# Patient Record
Sex: Female | Born: 1945 | Race: White | Hispanic: No | Smoking: Never smoker
Health system: Southern US, Community
[De-identification: ages and names within clinical notes are randomized; demographics above are authoritative.]

## PROBLEM LIST (undated history)

## (undated) DIAGNOSIS — S72009A Fracture of unspecified part of neck of unspecified femur, initial encounter for closed fracture: Secondary | ICD-10-CM

## (undated) DIAGNOSIS — I10 Essential (primary) hypertension: Secondary | ICD-10-CM

## (undated) HISTORY — DX: Fracture of unspecified part of neck of unspecified femur, initial encounter for closed fracture: S72.009A

---

## 2006-05-19 DIAGNOSIS — I10 Essential (primary) hypertension: Secondary | ICD-10-CM

## 2006-05-19 HISTORY — DX: Essential (primary) hypertension: I10

## 2011-11-17 DIAGNOSIS — S72009A Fracture of unspecified part of neck of unspecified femur, initial encounter for closed fracture: Secondary | ICD-10-CM

## 2011-11-17 HISTORY — DX: Fracture of unspecified part of neck of unspecified femur, initial encounter for closed fracture: S72.009A

## 2011-12-10 ENCOUNTER — Encounter (HOSPITAL_COMMUNITY): Payer: Self-pay | Admitting: Emergency Medicine

## 2011-12-10 ENCOUNTER — Emergency Department (HOSPITAL_COMMUNITY): Payer: Self-pay

## 2011-12-10 ENCOUNTER — Emergency Department (HOSPITAL_COMMUNITY)
Admission: EM | Admit: 2011-12-10 | Discharge: 2011-12-10 | Disposition: A | Discharge status: Home / Self Care | Payer: Self-pay | Attending: Emergency Medicine | Admitting: Emergency Medicine

## 2011-12-10 DIAGNOSIS — M5137 Other intervertebral disc degeneration, lumbosacral region: Secondary | ICD-10-CM | POA: Insufficient documentation

## 2011-12-10 DIAGNOSIS — S32599A Other specified fracture of unspecified pubis, initial encounter for closed fracture: Secondary | ICD-10-CM

## 2011-12-10 DIAGNOSIS — I10 Essential (primary) hypertension: Secondary | ICD-10-CM | POA: Insufficient documentation

## 2011-12-10 DIAGNOSIS — M51379 Other intervertebral disc degeneration, lumbosacral region without mention of lumbar back pain or lower extremity pain: Secondary | ICD-10-CM | POA: Insufficient documentation

## 2011-12-10 DIAGNOSIS — S32409A Unspecified fracture of unspecified acetabulum, initial encounter for closed fracture: Secondary | ICD-10-CM | POA: Insufficient documentation

## 2011-12-10 DIAGNOSIS — S32509A Unspecified fracture of unspecified pubis, initial encounter for closed fracture: Secondary | ICD-10-CM | POA: Insufficient documentation

## 2011-12-10 DIAGNOSIS — W010XXA Fall on same level from slipping, tripping and stumbling without subsequent striking against object, initial encounter: Secondary | ICD-10-CM | POA: Insufficient documentation

## 2011-12-10 DIAGNOSIS — M25559 Pain in unspecified hip: Secondary | ICD-10-CM | POA: Insufficient documentation

## 2011-12-10 DIAGNOSIS — M25529 Pain in unspecified elbow: Secondary | ICD-10-CM | POA: Insufficient documentation

## 2011-12-10 HISTORY — DX: Essential (primary) hypertension: I10

## 2011-12-10 LAB — POCT I-STAT, CHEM 8
Hemoglobin: 12.9 g/dL (ref 12.0–15.0)
Potassium: 3.9 mEq/L (ref 3.5–5.1)
Sodium: 139 mEq/L (ref 135–145)
TCO2: 22 mmol/L (ref 0–100)

## 2011-12-10 LAB — CBC
MCH: 30.2 pg (ref 26.0–34.0)
MCHC: 33.5 g/dL (ref 30.0–36.0)
Platelets: 276 10*3/uL (ref 150–400)
RBC: 4.21 MIL/uL (ref 3.87–5.11)

## 2011-12-10 MED ORDER — HYDROCODONE-ACETAMINOPHEN 5-325 MG PO TABS
1.0000 | ORAL_TABLET | Freq: Once | ORAL | Status: AC
  Administered 2011-12-10: 1 via ORAL
  Filled 2011-12-10: qty 1

## 2011-12-10 MED ORDER — PERCOCET 5-325 MG PO TABS: 1.0000 | ORAL_TABLET | Freq: Four times a day (QID) | ORAL | Status: AC | PRN

## 2011-12-10 NOTE — ED Notes (Signed)
Pt back from ct scanner with transport 

## 2011-12-10 NOTE — ED Provider Notes (Signed)
History     CSN: [id number]  Arrival date & time 12/10/11  0020   First MD Initiated Contact with Patient 12/10/11 0154      Chief Complaint  Patient presents with  . Fall    (Consider location/radiation/quality/duration/timing/severity/associated sxs/prior treatment) HPI Comments: Patient with a history of diabetes and hypertension presents emergency department with chief complaint of fall.  History is obtained from patient and translated through her daughter sitting bedside.  Patient states that while showering she slipped and fell with point of impact being her left elbow and her tailbone.  Fall was mechanical in nature and patient denies hitting her head or having any loss of consciousness.  Current pain is with left hip, left elbow and coccyx area.  She reports that she is able to ambulate however it is more painful and she has a limp.  Patient denies any neck pain, chest pain, headache, nausea, vomiting, abdominal pain, weakness, or numbness or tingling of extremities.  No other complaints this time.  Patient is a 66 y.o. female presenting with fall. The history is provided by the patient.  Fall Pertinent negatives include no fever, no numbness, no abdominal pain and no headaches.    Past Medical History  Diagnosis Date  . Diabetes mellitus   . Hypertension     History reviewed. No pertinent past surgical history.  History reviewed. No pertinent family history.  History  Substance Use Topics  . Smoking status: Never Smoker   . Smokeless tobacco: Not on file  . Alcohol Use: No    OB History    Grav Para Term Preterm Abortions TAB SAB Ect Mult Living                  Review of Systems  Constitutional: Positive for activity change. Negative for fever, chills and appetite change.  HENT: Negative for congestion.   Eyes: Negative for visual disturbance.  Respiratory: Negative for shortness of breath.   Cardiovascular: Negative for chest pain and leg swelling.    Gastrointestinal: Negative for abdominal pain.  Genitourinary: Negative for dysuria, urgency and frequency.  Musculoskeletal: Positive for gait problem.  Neurological: Negative for dizziness, syncope, weakness, light-headedness, numbness and headaches.  Psychiatric/Behavioral: Negative for confusion.  All other systems reviewed and are negative.    Allergies  Review of patient's allergies indicates no known allergies.  Home Medications   Current Outpatient Rx  Name Route Sig Dispense Refill  . PRESCRIPTION MEDICATION Oral Take 1 tablet by mouth daily. Patient says she is on Blood Pressure medication.  Called [facility] and they have no record of patient.      BP 153/77  Pulse 78  Temp 99.1 F (37.3 C) (Oral)  Resp 18  SpO2 98%  Physical Exam  Nursing note and vitals reviewed. Constitutional: She is oriented to person, place, and time. She appears well-developed and well-nourished. No distress.  HENT:  Head: Normocephalic and atraumatic.  Eyes: Conjunctivae and EOM are normal.  Neck: Normal range of motion.  Cardiovascular:       Intact distal pulses.  Pulmonary/Chest: Effort normal.  Musculoskeletal: Normal range of motion.       No leg length discrepancy.  Pain with internal and external rotation of left hip.  Tenderness to palpation over pubic ramus, left hip, left SI joint, & left elbow.  Normal range of motion of the elbow.  Neurological: She is alert and oriented to person, place, and time.  Skin: Skin is warm and dry. No rash  noted. She is not diaphoretic.       Refill less than 3 seconds.  Mild bruising on left upper extremity, dorsal surface overlying the proximal ulna  Psychiatric: She has a normal mood and affect. Her behavior is normal.    ED Course  Procedures (including critical care time)  Labs Reviewed  POCT I-STAT, CHEM 8 - Abnormal; Notable for the following:    Glucose, Bld 113 (*)     All other components within normal limits  CBC   Dg  Sacrum/coccyx  12/10/2011  *RADIOLOGY REPORT*  Clinical Data: Left hip pain.  SACRUM AND COCCYX - 2+ VIEW  Comparison: None.  Findings: Left inferior pubic ramus fractures noted.  Cannot exclude fracture of the left pubic body.  Possible transverse fracture the lower sacrum noted.  CT the pelvis could be utilized for further characterization.  IMPRESSION:  1.  Left inferior pubic ramus fracture.  Possible pubic body fracture on the left. 2.  Equivocal fracture of the left inferior sacrum.  CT could be utilized for further characterization if clinically warranted.  Original Report Authenticated By: Dellia Cloud, M.D.   Dg Elbow Complete Left  12/10/2011  *RADIOLOGY REPORT*  Clinical Data: Fall.  Posterior elbow pain.  LEFT ELBOW - COMPLETE 3+ VIEW  Comparison: None.  Findings: Spurring of the radial head and coronoid process noted. No elbow effusion is observed.  No fracture or malalignment identified.  There is abnormal subcutaneous edema dorsal to the proximal ulna.  IMPRESSION:  1.  Dorsal subcutaneous edema overlying the proximal ulna. 2.  Mild spurring. 3.   Otherwise, no significant abnormality identified.  Original Report Authenticated By: Dellia Cloud, M.D.   Dg Hip Complete Left  12/10/2011  *RADIOLOGY REPORT*  Clinical Data: Fall.  Left hip pain.  LEFT HIP - COMPLETE 2+ VIEW  Comparison: None.  Findings: Left inferior pubic ramus fracture noted.  Cannot exclude left pubic body fracture.  Such fractures are often accompanied by sacral fractures as well.  No fracture of the left proximal femur is observed.  IMPRESSION: 1.  Left inferior pubic ramus fracture.  Such a fracture is often accompanied by additional fractures of the left pubic body or superior ramus, and of the sacrum.  Original Report Authenticated By: Dellia Cloud, M.D.   Ct Pelvis Wo Contrast  12/10/2011  *RADIOLOGY REPORT*  Clinical Data:  Fall.  Pelvic pain.  CT PELVIS WITHOUT CONTRAST  Technique:   Multidetector CT imaging of the pelvis was performed following the standard protocol without intravenous contrast.  Comparison:  Conventional radiographs of 12/10/2011  Findings:  Relatively nondisplaced left acetabular fracture involving the anterior acetabulum and inferior pubic ramus noted. Definite extension up into the upper iliac bone is not observed to favor an anterior column fracture, and this is likely more of an anterior wall fracture.  There is spurring and irregularity of the pubic bodies bilaterally, but without a well seen pubic body fracture.  There is a mass-like density around the pubic bodies which could represent hematoma or may simply be secondary to arthropathy.  Slight irregularity in the lower sacrum near the sacrococcygeal junction noted, but without significant presacral stranding to further suggest fracture.  No sacral alar discontinuity noted.  Prominent disc bulge or disc protrusion noted at L4-5, likely with both central and foraminal stenosis.  Similar but less striking findings noted at L5 S1.  The appendix appears normal.  Urinary bladder appears unremarkable. The uterus and adnexa appear unremarkable.  IMPRESSION:  1.  Left acetabular nondisplaced anterior wall fracture with associated nondisplaced fracture the inferior pubic ramus. 2.  Spurring of the pubic bodies without obvious pubic body fracture.  Soft tissue mass-like appearance around the pubic bodies is probably due to arthropathy and less likely due to hematoma. 3.  Degenerative disc disease causing significant impingement at L4- 5 and a lesser degree impingement at L5-S1.  Original Report Authenticated By: Dellia Cloud, M.D.     No diagnosis found.    MDM  Fall, pubic ramus fracture,  Pt is a 66 yo that presented to ED s/p mechanical fall and dx with pubic ramus & acetabular fracutures. Discussed in depth disposition options with patient who states that they do not want to be admitted for pain control and  PT. Pt states they will be reliable with ortho f-u and OP physical therapy. Consult to Ortho placed & they are agreeable with plan to dc (Dr. Magnus Ivan). Recommended 1 wk follow up with orthopedics to asses mobility and pain control.        Jaci Carrel, [place] 12/10/11 [phone]

## 2011-12-10 NOTE — ED Notes (Addendum)
Pt speaks arabic- daughter at bedside translating; Pt slipped on water in bathroom, hit L elbow and L thigh when fell; now c/o pain; having difficulty walking now per daughter; CMS intact

## 2011-12-10 NOTE — ED Notes (Signed)
Pt taken to ct scanner with tech

## 2011-12-10 NOTE — ED Provider Notes (Signed)
Medical screening examination/treatment/procedure(s) were performed by non-physician practitioner and as supervising physician I was immediately available for consultation/collaboration.  Keyonni Percival K Emmabelle Fear, MD 12/10/11 0706 

## 2011-12-10 NOTE — ED Notes (Signed)
Phlebotomy at bedside.

## 2011-12-10 NOTE — ED Notes (Signed)
Ortho tech paged for crutches.  

## 2011-12-10 NOTE — ED Notes (Signed)
MD at bedside to speak to family and pt

## 2011-12-10 NOTE — ED Notes (Signed)
Pt alert and oriented, with steady gait at time of discharge. Pt given discharge papers and papers explained. All questions answered and pt wheeled to discharge.  

## 2012-01-05 ENCOUNTER — Ambulatory Visit: Payer: Self-pay | Admitting: Family Medicine

## 2012-01-12 ENCOUNTER — Ambulatory Visit (INDEPENDENT_AMBULATORY_CARE_PROVIDER_SITE_OTHER): Payer: Self-pay | Admitting: Family Medicine

## 2012-01-12 ENCOUNTER — Encounter: Payer: Self-pay | Admitting: Family Medicine

## 2012-01-12 VITALS — BP 188/81 | HR 78 | Temp 98.2°F | Ht 64.0 in | Wt 181.0 lb

## 2012-01-12 DIAGNOSIS — E119 Type 2 diabetes mellitus without complications: Secondary | ICD-10-CM | POA: Insufficient documentation

## 2012-01-12 DIAGNOSIS — S72009A Fracture of unspecified part of neck of unspecified femur, initial encounter for closed fracture: Secondary | ICD-10-CM

## 2012-01-12 DIAGNOSIS — M545 Low back pain, unspecified: Secondary | ICD-10-CM | POA: Insufficient documentation

## 2012-01-12 DIAGNOSIS — I10 Essential (primary) hypertension: Secondary | ICD-10-CM

## 2012-01-12 DIAGNOSIS — R079 Chest pain, unspecified: Secondary | ICD-10-CM

## 2012-01-12 DIAGNOSIS — R0789 Other chest pain: Secondary | ICD-10-CM | POA: Insufficient documentation

## 2012-01-12 MED ORDER — LISINOPRIL 5 MG PO TABS: 5.0000 mg | ORAL_TABLET | Freq: Every day | ORAL | Status: DC

## 2012-01-12 MED ORDER — ASPIRIN 81 MG PO TBEC: 81.0000 mg | DELAYED_RELEASE_TABLET | Freq: Every day | ORAL | Status: DC

## 2012-01-12 MED ORDER — NITROGLYCERIN 0.4 MG SL SUBL: 0.4000 mg | SUBLINGUAL_TABLET | SUBLINGUAL | Status: AC | PRN

## 2012-01-12 NOTE — Assessment & Plan Note (Signed)
CT of her pelvis done at the time of her hip fracture July 2013 shows DJD and impingement of L4-5 and L5-S1. Since she is already established with Dr. Magnus Ivan, advised she make a follow-up appointment with him to discuss her chronic lower back pain. She may be having worsening back pain now due to spasms with her new hip fracture. Consider starting muscle relaxant (baclofen). Recommending OTC Tylenol/ibuprofen and warm compresses for now.

## 2012-01-12 NOTE — Progress Notes (Signed)
  Subjective:    Patient ID: Ellen Moore, female    DOB: 03-May-1946, 66 y.o.   MRN: [id number]  HPI # Hypertension She had been on medication in the past (?in [place]) but not currently  # Diabetes Her doctor in [place] gave her this diagnosis She is not on medication  # Recent hip fracture, chronic back pain She followed-up with Dr. Magnus Ivan on 07/24 for her acute hip fracture falling slip in the bathroom Her chronic back pain has been worse since then. She denies paresthesias, urine/bladder incontinence, weakness. The pain is getting worse, however. She is not taking any medication for it.   Review of Systems Per HPI She has been complaining of chest pain that occurs when she is walking and improves when she sits down. Not associated with radiation, dyspnea, or nausea. Denies headaches. Denies lower leg edema.    Objective:   Physical Exam GEN: NAD; accompanied by daughter who is interpreting CV: RRR, no m/r/g PULM: NI WOB; CTAB without w/r/r EXT: no edema BACK: tenderness along light SI joint; no mid-line tenderness; negative straight-leg raises; 4/5 flexion, extension strength bilateral hips and intact flexion but extension 30 deg of spine; sensation intact throughout   GAIT: antalgic, favoring right side    Assessment & Plan:

## 2012-01-12 NOTE — Patient Instructions (Addendum)
For your blood pressure -Start lisinopril -Please check blood pressures at a pharmacy 2-3 times a week and bring these numbers to me -We can discuss your lab results at your follow-up visit -Follow-up in 2 weeks  For chest pain -We will refer you to a heart doctor (cardiologist)  For back pain -Please make an appointment with Dr. Magnus Ivan (orthopedist), the doctor you saw for the hip fracture

## 2012-01-12 NOTE — Assessment & Plan Note (Signed)
Elevated today. Will start lisinopril, low-dose 5 mg in this geriatric patient. Follow-up in 2 weeks.

## 2012-01-12 NOTE — Assessment & Plan Note (Signed)
Patient reports chest pain with exertion and improved with rest. Atypical since it does not radiate and not associated with dyspnea or nausea. However, her history of ?diabetes, uncontrolled hypertension, and age concerning. Will check FLP today. Will start ASA 81 today. I would like her referred for a chemical stress test (she is not able to perform an exercise stress test due to her recent hip fracture), however, she has no insurance and does not have the [employer] yet. She would like to wait before being referred. She and her daughter was given indications to go to the ED and given an Rx for NTG to take prn.

## 2012-01-12 NOTE — Assessment & Plan Note (Signed)
Told by a doctor in [place] she had diabetes. Will check HgbA1c today.

## 2012-02-18 ENCOUNTER — Other Ambulatory Visit: Payer: Self-pay | Admitting: Family Medicine

## 2012-03-22 ENCOUNTER — Inpatient Hospital Stay (HOSPITAL_COMMUNITY)
Admission: AD | Admit: 2012-03-22 | Discharge: 2012-03-24 | DRG: 313 | Disposition: A | Discharge status: Home / Self Care | Payer: Self-pay | Source: Ambulatory Visit | Attending: Family Medicine | Admitting: Family Medicine

## 2012-03-22 ENCOUNTER — Inpatient Hospital Stay (HOSPITAL_COMMUNITY): Payer: Self-pay

## 2012-03-22 ENCOUNTER — Ambulatory Visit (INDEPENDENT_AMBULATORY_CARE_PROVIDER_SITE_OTHER): Payer: Self-pay | Admitting: Family Medicine

## 2012-03-22 ENCOUNTER — Encounter: Payer: Self-pay | Admitting: Family Medicine

## 2012-03-22 ENCOUNTER — Ambulatory Visit (HOSPITAL_COMMUNITY)
Admission: RE | Admit: 2012-03-22 | Discharge: 2012-03-22 | Disposition: A | Discharge status: Home / Self Care | Payer: Self-pay | Source: Ambulatory Visit | Attending: Family Medicine | Admitting: Family Medicine

## 2012-03-22 VITALS — BP 170/85 | HR 86 | Ht 65.5 in | Wt 185.0 lb

## 2012-03-22 DIAGNOSIS — E7800 Pure hypercholesterolemia, unspecified: Secondary | ICD-10-CM | POA: Diagnosis present

## 2012-03-22 DIAGNOSIS — R0789 Other chest pain: Secondary | ICD-10-CM

## 2012-03-22 DIAGNOSIS — I10 Essential (primary) hypertension: Secondary | ICD-10-CM | POA: Diagnosis present

## 2012-03-22 DIAGNOSIS — K219 Gastro-esophageal reflux disease without esophagitis: Secondary | ICD-10-CM

## 2012-03-22 DIAGNOSIS — I209 Angina pectoris, unspecified: Secondary | ICD-10-CM

## 2012-03-22 DIAGNOSIS — E041 Nontoxic single thyroid nodule: Secondary | ICD-10-CM | POA: Diagnosis present

## 2012-03-22 DIAGNOSIS — R079 Chest pain, unspecified: Principal | ICD-10-CM | POA: Diagnosis present

## 2012-03-22 DIAGNOSIS — Z23 Encounter for immunization: Secondary | ICD-10-CM

## 2012-03-22 LAB — COMPREHENSIVE METABOLIC PANEL
ALT: 7 U/L (ref 0–35)
AST: 23 U/L (ref 0–37)
Alkaline Phosphatase: 94 U/L (ref 39–117)
CO2: 25 mEq/L (ref 19–32)
Calcium: 9.3 mg/dL (ref 8.4–10.5)
GFR calc Af Amer: >90 mL/min (ref >90)
GFR calc non Af Amer: >90 mL/min (ref >90)
Glucose, Bld: 111 mg/dL — ABNORMAL HIGH (ref 70–99)
Potassium: 3.9 mEq/L (ref 3.5–5.1)
Sodium: 139 mEq/L (ref 135–145)
Total Protein: 7.8 g/dL (ref 6.0–8.3)

## 2012-03-22 LAB — LIPASE, BLOOD: Lipase: 24 U/L (ref 11–59)

## 2012-03-22 LAB — HEMOGLOBIN A1C: Mean Plasma Glucose: 123 mg/dL — ABNORMAL HIGH (ref <117)

## 2012-03-22 LAB — CBC
HCT: 38.4 % (ref 36.0–46.0)
Hemoglobin: 12.7 g/dL (ref 12.0–15.0)
MCHC: 33.1 g/dL (ref 30.0–36.0)
RBC: 4.31 MIL/uL (ref 3.87–5.11)

## 2012-03-22 MED ORDER — METOPROLOL TARTRATE 12.5 MG HALF TABLET
12.5000 mg | ORAL_TABLET | Freq: Two times a day (BID) | ORAL | Status: DC
  Administered 2012-03-22 – 2012-03-23 (×3): 12.5 mg via ORAL
  Filled 2012-03-22 (×5): qty 1

## 2012-03-22 MED ORDER — SODIUM CHLORIDE 0.9 % IV SOLN: 250.0000 mL | INTRAVENOUS | Status: DC | PRN

## 2012-03-22 MED ORDER — ASPIRIN EC 81 MG PO TBEC
81.0000 mg | DELAYED_RELEASE_TABLET | Freq: Every day | ORAL | Status: DC
  Administered 2012-03-23: 81 mg via ORAL
  Filled 2012-03-22 (×2): qty 1

## 2012-03-22 MED ORDER — SODIUM CHLORIDE 0.9 % IJ SOLN
3.0000 mL | Freq: Two times a day (BID) | INTRAMUSCULAR | Status: DC
  Administered 2012-03-22 – 2012-03-23 (×3): 3 mL via INTRAVENOUS

## 2012-03-22 MED ORDER — ASPIRIN 325 MG PO TABS
325.0000 mg | ORAL_TABLET | Freq: Once | ORAL | Status: AC
  Administered 2012-03-22: 325 mg via ORAL

## 2012-03-22 MED ORDER — ACETAMINOPHEN 325 MG PO TABS
650.0000 mg | ORAL_TABLET | ORAL | Status: DC | PRN
  Administered 2012-03-22: 650 mg via ORAL
  Filled 2012-03-22: qty 2

## 2012-03-22 MED ORDER — NITROGLYCERIN 0.4 MG SL SUBL
0.4000 mg | SUBLINGUAL_TABLET | SUBLINGUAL | Status: DC | PRN
  Administered 2012-03-22: 0.4 mg via SUBLINGUAL

## 2012-03-22 MED ORDER — ENOXAPARIN SODIUM 40 MG/0.4ML ~~LOC~~ SOLN
40.0000 mg | SUBCUTANEOUS | Status: DC
  Administered 2012-03-22 – 2012-03-23 (×2): 40 mg via SUBCUTANEOUS
  Filled 2012-03-22 (×3): qty 0.4

## 2012-03-22 MED ORDER — SODIUM CHLORIDE 0.9 % IJ SOLN: 3.0000 mL | INTRAMUSCULAR | Status: DC | PRN

## 2012-03-22 MED ORDER — ONDANSETRON HCL 4 MG/2ML IJ SOLN: 4.0000 mg | Freq: Four times a day (QID) | INTRAMUSCULAR | Status: DC | PRN

## 2012-03-22 NOTE — H&P (Signed)
I have seen and examined Ms. Arriola with Dr. Armen Pickup and agree with her plan of care and her note.

## 2012-03-22 NOTE — Progress Notes (Signed)
Family Medicine Progress Note:   S: Spoke with patient by [employer] in Arabic. Denies current chest or back pain. Patient states only gets pain if she gets out of bed and starts walking (says this is typical pattern as well and that rest relieves pain).  denies reduced pain with nitroglycerin she was given earlier. Patient does have some slight right calf pain for which she received tylenol. States has had this pain for over 5 years.   O: BP 138/66  Pulse 77  Temp 98.6 F (37 C)  Resp 20  SpO2 98% Gen: NAD, resting comfortably in bed CV: RRR no mrg  Lungs: CTAB  Abd: soft/nontender/nondistended/normal bowel sounds  MSK: no edema. 10cm below tibial plateau, patient with both calves within 5mm in size. No cords. Mild tenderness throughout calves.   A/P: Patient stable at rest without pain -troponin and lipase negative -continue to follow troponins -will get cards consult in AM -follow up risk stratification labs -will make NPO in case of stress test in AM  Tana Conch, MD, PGY2 03/22/2012 9:34 PM

## 2012-03-22 NOTE — Progress Notes (Signed)
Patient ID: Ellen Moore, female   DOB: Apr 21, 1946, 66 y.o.   MRN: [id number] I have seen and examined [name] with Dr. Armen Pickup.  I agree with her plan of care and her note.  In brief, [name] is a 66 yo female with HTN and otherwise unclear medical hx who presents with worsening chest pain on exertion.  Pain has been increasing in the last day.  She has been told in the past that she has diabetes, but is not on any medicines for this.  She is not a smoker. We will admit her for possible unstable angina and cycle enzymes, attempt blood pressure control.  Consider inpatient cardiac stress testing.

## 2012-03-22 NOTE — Progress Notes (Signed)
Patient ID: Ellen Moore, female   DOB: 09/17/1945, 66 y.o.   MRN: [id number] Ellen Moore is an 66 y.o. female.   PCP: Dr. Angela Oh Park  Chief Complaint: chest pain  HPI:  66 yo F with history of HTN x 5 years and atypical chest pain presents with one day of L sided chest tightness. Chest tightness started yesterday afternoon. It radiates to the back. It is associated with dizziness, headache, lightheadedness and nausea. She denies shortness of breath and vomiting. Symptoms are persistent. Worse with exertion. She denies anterior chest pain currently but admits to L upper back pain. She was prescribed NTG to take as needed, but has not taken it. She takes an 81 mg aspirin daily but did not take it today.  She is a non-smoker. Never smoked.    Past Medical History  Diagnosis Date  . Hypertension 2008  . Fracture, hip 11/2011    Fell and fractured pubic ramus and acetabulum   No past surgical history on file.  No family history on file.  History   Social History  . Marital Status: Widowed    Spouse Name: N/A    Number of Children: N/A  . Years of Education: N/A   Occupational History  . Not on file.   Social History Main Topics  . Smoking status: Never Smoker   . Smokeless tobacco: Never Used  . Alcohol Use: No  . Drug Use: No  . Sexually Active:    Other Topics Concern  . Not on file   Social History Narrative   Lives with son-in-lawSpeaks Arabic From [place]     Allergies:  Allergies  Allergen Reactions  . Penicillins    ROS Persistent ROS as per HPI   Blood pressure 170/85, pulse 86, height 5' 5.5" (1.664 m), weight 185 lb (83.915 kg), SpO2 98.00%. BP Readings from Last 3 Encounters:  03/22/12 170/85  01/12/12 188/81  12/10/11 141/75   Physical Exam  General appearance: alert, cooperative, appears older than stated age and no distress Head: Normocephalic, without obvious abnormality, atraumatic Eyes: conjunctivae/corneas clear. PERRL, EOM's  intact. Throat: lips, mucosa, and tongue normal; teeth and gums normal Neck: no adenopathy, no carotid bruit, no JVD, supple, symmetrical, trachea midline and thyroid not enlarged, symmetric, no tenderness/mass/nodules Lungs: clear to auscultation bilaterally Heart: regular rate and rhythm, S1, S2 normal, no murmur, click, rub or gallop Abdomen: soft, non-tender; bowel sounds normal; no masses,  no organomegaly Extremities: extremities normal, atraumatic, no cyanosis or edema Pulses: 2+ and symmetric Skin: no rash.  Neurologic: normal strength and sensation. Unsteady gait.   EKG: normal sinus rhythm, T wave inversion in the lateral leads. No comparison.   Assessment/Plan 66 yo F with poorly controlled HTN and atypical chest pain presents with chest pain concerning for unstable angina.   1. Chest pain: concerning for unstable angina.  P:  -full dose ASA and sublingual nitroglycerin now.  -admit to telemetry -cycle cardiac enzymes  -CXR - risk stratify with fasting lipid panel and A1c.  -rule out concomitant thyroid dysfunction with TSH.  -rule out demand ischemia with CBC (Hgb).  -daily ASA -daily BB -repeat EKG in AM -zofran prn nausea -morphine prn pain  -primary team to consult cardiology for in patient stress test.   2. HTN A: poorly controlled on ACE inhibitor. P: BMP Add BB Likely increase dose of ACE inhibitor Goal BP < 140/90   3. FEN/GI: check electrolytes. Cardiac diet. May need NPO if stress test planned.     4. DVT PPx: lovenox.   5. Dispo: pending work-up for chest pain and clinical improvement to home.    Ellen Moore 03/22/2012, 3:52 PM   

## 2012-03-22 NOTE — H&P (Signed)
Patient ID: Shamekia Tippets, female   DOB: 26-Nov-1945, 65 y.o.   MRN: [id number] Lesley Atkin is an 66 y.o. female.   PCP: Dr. Lucianne Muss Park  Chief Complaint: chest pain  HPI:  66 yo F with history of HTN x 5 years and atypical chest pain presents with one day of L sided chest tightness. Chest tightness started yesterday afternoon. It radiates to the back. It is associated with dizziness, headache, lightheadedness and nausea. She denies shortness of breath and vomiting. Symptoms are persistent. Worse with exertion. She denies anterior chest pain currently but admits to L upper back pain. She was prescribed NTG to take as needed, but has not taken it. She takes an 81 mg aspirin daily but did not take it today.  She is a non-smoker. Never smoked.    Past Medical History  Diagnosis Date  . Hypertension 2008  . Fracture, hip 11/2011    Larey Seat and fractured pubic ramus and acetabulum   No past surgical history on file.  No family history on file.  History   Social History  . Marital Status: Widowed    Spouse Name: N/A    Number of Children: N/A  . Years of Education: N/A   Occupational History  . Not on file.   Social History Main Topics  . Smoking status: Never Smoker   . Smokeless tobacco: Never Used  . Alcohol Use: No  . Drug Use: No  . Sexually Active:    Other Topics Concern  . Not on file   Social History Narrative   Lives with son-in-lawSpeaks Arabic From [place]     Allergies:  Allergies  Allergen Reactions  . Penicillins    ROS Persistent ROS as per HPI   Blood pressure 170/85, pulse 86, height 5' 5.5" (1.664 m), weight 185 lb (83.915 kg), SpO2 98.00%. BP Readings from Last 3 Encounters:  03/22/12 170/85  01/12/12 188/81  12/10/11 141/75   Physical Exam  General appearance: alert, cooperative, appears older than stated age and no distress Head: Normocephalic, without obvious abnormality, atraumatic Eyes: conjunctivae/corneas clear. PERRL, EOM's  intact. Throat: lips, mucosa, and tongue normal; teeth and gums normal Neck: no adenopathy, no carotid bruit, no JVD, supple, symmetrical, trachea midline and thyroid not enlarged, symmetric, no tenderness/mass/nodules Lungs: clear to auscultation bilaterally Heart: regular rate and rhythm, S1, S2 normal, no murmur, click, rub or gallop Abdomen: soft, non-tender; bowel sounds normal; no masses,  no organomegaly Extremities: extremities normal, atraumatic, no cyanosis or edema Pulses: 2+ and symmetric Skin: no rash.  Neurologic: normal strength and sensation. Unsteady gait.   EKG: normal sinus rhythm, T wave inversion in the lateral leads. No comparison.   Assessment/Plan 66 yo F with poorly controlled HTN and atypical chest pain presents with chest pain concerning for unstable angina.   1. Chest pain: concerning for unstable angina.  P:  -full dose ASA and sublingual nitroglycerin now.  -admit to telemetry -cycle cardiac enzymes  -CXR - risk stratify with fasting lipid panel and A1c.  -rule out concomitant thyroid dysfunction with TSH.  -rule out demand ischemia with CBC (Hgb).  -daily ASA -daily BB -repeat EKG in AM -zofran prn nausea -morphine prn pain  -primary team to consult cardiology for in patient stress test.   2. HTN A: poorly controlled on ACE inhibitor. P: BMP Add BB Likely increase dose of ACE inhibitor Goal BP < 140/90   3. FEN/GI: check electrolytes. Cardiac diet. May need NPO if stress test planned.  4. DVT PPx: lovenox.   5. Dispo: pending work-up for chest pain and clinical improvement to home.    Mihail Prettyman 03/22/2012, 3:52 PM

## 2012-03-23 ENCOUNTER — Encounter (HOSPITAL_COMMUNITY): Payer: Self-pay | Admitting: *Deleted

## 2012-03-23 LAB — LIPID PANEL
Cholesterol: 181 mg/dL (ref 0–200)
HDL: 42 mg/dL (ref >39)
Total CHOL/HDL Ratio: 4.3 RATIO
Triglycerides: 142 mg/dL (ref <150)
VLDL: 28 mg/dL (ref 0–40)

## 2012-03-23 LAB — TROPONIN I
Troponin I: <0.3 ng/mL (ref <0.30)
Troponin I: <0.3 ng/mL (ref <0.30)

## 2012-03-23 MED ORDER — ATORVASTATIN CALCIUM 40 MG PO TABS
40.0000 mg | ORAL_TABLET | Freq: Every day | ORAL | Status: DC
  Administered 2012-03-23: 40 mg via ORAL
  Filled 2012-03-23 (×2): qty 1

## 2012-03-23 NOTE — Progress Notes (Signed)
Family medicine Teaching Service:  Subjective:  Ellen Moore 66 yo F w/ hx HTN, who presents with chronic chest pain of 5 years w/ recent 3 day worsening of CP.  Overnight events: (Arabic speaking only, communicated with Electronics engineer via phone. Daughter in room with pt)  CP: Doing "okay now", currently "no chest pain" while in bed at rest. Reports that her pain has improved since she has been in the hospital. Admits to HA when CP started 3 days ago. Denies any SoB or breathing difficulties. She is currently NPO.  ROS: Denies any active CP, SoB, reflux symptoms, edema.   Objective: Vital signs in last 24 hours: Temp:  [98.3 F (36.8 C)-98.7 F (37.1 C)] 98.7 F (37.1 C) (11/05 0725) Pulse Rate:  [72-86] 73  (11/05 0500) Resp:  [14-23] 20  (11/05 0725) BP: (108-170)/(54-85) 136/73 mmHg (11/05 0725) SpO2:  [94 %-99 %] 94 % (11/05 0725) Weight:  [184 lb 15.5 oz (83.9 kg)-185 lb (83.915 kg)] 184 lb 15.5 oz (83.9 kg) (11/05 0035) Weight change:     Intake/Output from previous day: 11/04 0701 - 11/05 0700 In: 260 [P.O.:240; I.V.:20] Out: 201 [Urine:200; Stool:1] Intake/Output this shift:    General appearance: alert, cooperative and no distress Neck: no adenopathy Resp: clear to auscultation bilaterally Cardio: regular rate and rhythm, S1, S2 normal, no murmur, click, rub or gallop GI: soft, non-tender; bowel sounds normal; no masses,  no organomegaly Extremities: extremities normal, atraumatic, no cyanosis or edema Pulses: 2+ and symmetric Skin: Skin color, texture, turgor normal. No rashes or lesions  Lab Results:  Basename 03/22/12 1740  WBC 7.6  HGB 12.7  HCT 38.4  PLT 267   BMET  Basename 03/22/12 1740  NA 139  K 3.9  CL 102  CO2 25  GLUCOSE 111*  BUN 14  CREATININE 0.63  CALCIUM 9.3   Troponin negative x 3.   EKG: sinus arrhythmia 73 bpm, nonspecific T-wave flattening V4-6 (changed from inversions yesterday).   Studies/Results: Dg  Chest Port 1 View  03/22/2012  *RADIOLOGY REPORT*  Clinical Data: 66 year old female chest pain.  PORTABLE CHEST - 1 VIEW  Comparison: None.  Findings: Portable semi upright AP view.  Lung volumes are within normal limits.  Rightward deviation of the trachea at the thoracic inlet with increased density at the left thoracic inlet suggestive of goiter. Other mediastinal contours are within normal limits.  No pneumothorax or pulmonary edema.  No pleural effusion or confluent pulmonary opacity.  IMPRESSION:  1.  Left thyroid mass/goiter suspected. 2. No acute cardiopulmonary abnormality.   Original Report Authenticated By: Erskine Speed, M.D.     Medications:  Scheduled:   . aspirin EC  81 mg Oral Daily  . enoxaparin (LOVENOX) injection  40 mg Subcutaneous Q24H  . metoprolol tartrate  12.5 mg Oral BID  . sodium chloride  3 mL Intravenous Q12H   ZOX:WRUEAV chloride, acetaminophen, ondansetron (ZOFRAN) IV, sodium chloride  Assessment/Plan:  Ellen Moore 66 yo F w/ hx HTN, who presents with chronic chest pain of 5 years w/ recent 3 day worsening of exertional CP, concerning for unstable angina.  # Chest Pain (POA) Diff dx: -Stable Angina vs. Possible Unstable Angina vs. Possible ACS      - Stable. No active CP. Pre-test probability likelihood of CAD - Atypical CP (54%) - Typical CP (91%), +Substernal, +Worse with exertion, +/- relief w/ NTG. Likelihood of ACS - Intermediate Risk due to +CP, T-wave inversions >34mm, w/ normal Troponin. Risk  stratification: LDL 111, HgA1c 5.9% (pre-DM).       - Troponin (negative x3)       - EKG (11/4) w/ T-wave inversions V4-V6 (lateral), EKG (11/5) w/ T-wave flattening V4-V6 - unlikely GERD/Esophageal spasm - hx of GERD. Not related to eating. Denies current reflux sx Plan: - continue ASA 81mg  daily - continue Metoprolol 12.5 BID -borderline LDL. May consider starting statin. - NTG - c/s Cardiology (Dr. Sharyn Lull) - evaluation for possible CAD. F/u recs.  # HTN  (POA) - Stable. Well-controlled. BP on admission 156/82. Improved to 120-130s/60-70s - continue Metoprolol 12.5 BID  # Possible L Thyroid Mass/Goiter (POA)-Mass visualized on CXR (11/4) - Stable. no reported hx of thyroid disease or subjective airway obstructive symptoms. Consider hx of iodine deficiency.  -consider outpatient workup - TSH normal 0.356  # FEN Fluids -  Electrolytes - no abnormalities Nutrition - currently NPO pending possible procedures.  PRN - Zofran 4mg  IM nausea, Tylenol 650mg  mild pain  # Prophylaxis VTE - Lovenox 40mg  IM daily   LOS: 1 day   Ellen Moore 03/23/2012, 8:18 AM

## 2012-03-23 NOTE — Progress Notes (Signed)
Family Medicine Teaching Service Attending Note  I interviewed and examined patient Ellen Moore and reviewed their tests and x-rays.  I discussed with Dr. Cristal Ford and reviewed their note for today.  I agree with their assessment and plan.     Additionally  No pain currently Appreciate Cardiology recommendations

## 2012-03-23 NOTE — Consult Note (Signed)
Reason for Consult: Recurrent chest pain Referring Physician: Family medicine   Ellen Moore is an 66 y.o. female.  HPI: Patient is 66 year old female from [place] with past medical history significant for hypertension, hypercholesteremia, thyroid nodule, was admitted yesterday because of recurrent retrosternal and left-sided chest pain radiating to back with minimal exertion off and on for last 5 years. States her pain in the last 3 days has gone progressively worse with minimal exertion so decided to come to the ER. Patient denies any chest pain at present. EKG done initially showed minor T wave changes in lateral leads repeat EKG this morning showed no acute ischemic changes. Patient denies any palpitation lightheadedness or syncope. Denies any PND orthopnea leg swelling. States seen Dr. in [place] but was treated medically. Denies any cough fever chills denies any back pain at present patient does not speak English all history obtained from her daughter. Denies any cardiac workup in the past  Past Medical History  Diagnosis Date  . Hypertension 2008  . Fracture, hip 11/2011    Larey Seat and fractured pubic ramus and acetabulum    History reviewed. No pertinent past surgical history.  Family History  Problem Relation Age of Onset  . Hypertension      Social History:  reports that she has never smoked. She has never used smokeless tobacco. She reports that she does not drink alcohol or use illicit drugs.  Allergies:  Allergies  Allergen Reactions  . Penicillins     Unknown    Medications: I have reviewed the patient's current medications.  Results for orders placed during the hospital encounter of 03/22/12 (from the past 48 hour(s))  HEMOGLOBIN A1C     Status: Abnormal   Collection Time   03/22/12  5:40 PM      Component Value Range Comment   Hemoglobin A1C 5.9 (*) <5.7 %    Mean Plasma Glucose 123 (*) <117 mg/dL   COMPREHENSIVE METABOLIC PANEL     Status: Abnormal   Collection Time     03/22/12  5:40 PM      Component Value Range Comment   Sodium 139  135 - 145 mEq/L    Potassium 3.9  3.5 - 5.1 mEq/L    Chloride 102  96 - 112 mEq/L    CO2 25  19 - 32 mEq/L    Glucose, Bld 111 (*) 70 - 99 mg/dL    BUN 14  6 - 23 mg/dL    Creatinine, Ser 1.47  0.50 - 1.10 mg/dL    Calcium 9.3  8.4 - 82.9 mg/dL    Total Protein 7.8  6.0 - 8.3 g/dL    Albumin 3.8  3.5 - 5.2 g/dL    AST 23  0 - 37 U/L    ALT 7  0 - 35 U/L    Alkaline Phosphatase 94  39 - 117 U/L    Total Bilirubin 0.3  0.3 - 1.2 mg/dL    GFR calc non Af Amer >90  >90 mL/min    GFR calc Af Amer >90  >90 mL/min   TSH     Status: Normal   Collection Time   03/22/12  5:40 PM      Component Value Range Comment   TSH 0.356  0.350 - 4.500 uIU/mL   TROPONIN I     Status: Normal   Collection Time   03/22/12  5:40 PM      Component Value Range Comment   Troponin I <0.30  <  0.30 ng/mL   CBC     Status: Normal   Collection Time   03/22/12  5:40 PM      Component Value Range Comment   WBC 7.6  4.0 - 10.5 K/uL    RBC 4.31  3.87 - 5.11 MIL/uL    Hemoglobin 12.7  12.0 - 15.0 g/dL    HCT 08.6  57.8 - 46.9 %    MCV 89.1  78.0 - 100.0 fL    MCH 29.5  26.0 - 34.0 pg    MCHC 33.1  30.0 - 36.0 g/dL    RDW 62.9  52.8 - 41.3 %    Platelets 267  150 - 400 K/uL   LIPASE, BLOOD     Status: Normal   Collection Time   03/22/12  5:40 PM      Component Value Range Comment   Lipase 24  11 - 59 U/L   MRSA PCR SCREENING     Status: Normal   Collection Time   03/22/12  5:42 PM      Component Value Range Comment   MRSA by PCR NEGATIVE  NEGATIVE   TROPONIN I     Status: Normal   Collection Time   03/22/12 10:53 PM      Component Value Range Comment   Troponin I <0.30  <0.30 ng/mL   TROPONIN I     Status: Normal   Collection Time   03/23/12  5:50 AM      Component Value Range Comment   Troponin I <0.30  <0.30 ng/mL   LIPID PANEL     Status: Abnormal   Collection Time   03/23/12  5:50 AM      Component Value Range Comment    Cholesterol 181  0 - 200 mg/dL    Triglycerides 244  <010 mg/dL    HDL 42  >27 mg/dL    Total CHOL/HDL Ratio 4.3      VLDL 28  0 - 40 mg/dL    LDL Cholesterol 253 (*) 0 - 99 mg/dL     Dg Chest Port 1 View  03/22/2012  *RADIOLOGY REPORT*  Clinical Data: 66 year old female chest pain.  PORTABLE CHEST - 1 VIEW  Comparison: None.  Findings: Portable semi upright AP view.  Lung volumes are within normal limits.  Rightward deviation of the trachea at the thoracic inlet with increased density at the left thoracic inlet suggestive of goiter. Other mediastinal contours are within normal limits.  No pneumothorax or pulmonary edema.  No pleural effusion or confluent pulmonary opacity.  IMPRESSION:  1.  Left thyroid mass/goiter suspected. 2. No acute cardiopulmonary abnormality.   Original Report Authenticated By: Erskine Speed, M.D.     Review of Systems  Constitutional: Negative for fever and chills.  Eyes: Negative for blurred vision and double vision.  Respiratory: Negative for cough, hemoptysis, sputum production and shortness of breath.   Cardiovascular: Positive for chest pain. Negative for palpitations, orthopnea, claudication and leg swelling.  Gastrointestinal: Negative for nausea, vomiting and abdominal pain.  Neurological: Negative for dizziness and headaches.   Blood pressure 132/87, pulse 73, temperature 98.6 F (37 C), temperature source Oral, resp. rate 16, height 5' 5.5" (1.664 m), weight 83.9 kg (184 lb 15.5 oz), SpO2 98.00%. Physical Exam  Constitutional: She is oriented to person, place, and time. She appears well-developed and well-nourished.  HENT:  Head: Normocephalic and atraumatic.  Nose: Nose normal.  Mouth/Throat: No oropharyngeal exudate.  Eyes: Conjunctivae normal and EOM  are normal. Pupils are equal, round, and reactive to light. Left eye exhibits no discharge. No scleral icterus.  Neck: Normal range of motion. Neck supple. No JVD present. No tracheal deviation present.  No thyromegaly present.  Cardiovascular: Normal rate, regular rhythm and normal heart sounds.  Exam reveals no friction rub.   No murmur heard. Respiratory: Effort normal. No respiratory distress. She has no wheezes. She has no rales.  GI: Soft. Bowel sounds are normal. She exhibits no distension. There is no tenderness. There is no rebound.  Musculoskeletal: She exhibits no edema and no tenderness.  Neurological: She is alert and oriented to person, place, and time.    Assessment/Plan: Status post chest pain MI ruled out worrisome for angina Hypertension Hypercholesteremia Thyroid nodule Plan Agree with present management Add statin as per orders Schedule for nuclear stress test in a.m.  Robynn Pane 03/23/2012, 12:35 PM

## 2012-03-24 ENCOUNTER — Encounter (HOSPITAL_COMMUNITY): Payer: Self-pay

## 2012-03-24 ENCOUNTER — Inpatient Hospital Stay (HOSPITAL_COMMUNITY): Payer: Self-pay

## 2012-03-24 MED ORDER — REGADENOSON 0.4 MG/5ML IV SOLN
0.4000 mg | Freq: Once | INTRAVENOUS | Status: AC
  Administered 2012-03-24: 0.4 mg via INTRAVENOUS
  Filled 2012-03-24: qty 5

## 2012-03-24 MED ORDER — ATORVASTATIN CALCIUM 40 MG PO TABS: 40.0000 mg | ORAL_TABLET | Freq: Every day | ORAL | Status: DC

## 2012-03-24 MED ORDER — METOPROLOL TARTRATE 12.5 MG HALF TABLET: 12.5000 mg | ORAL_TABLET | Freq: Two times a day (BID) | ORAL | Status: DC

## 2012-03-24 MED ORDER — TECHNETIUM TC 99M SESTAMIBI GENERIC - CARDIOLITE
30.0000 | Freq: Once | INTRAVENOUS | Status: AC | PRN
  Administered 2012-03-24: 30 via INTRAVENOUS

## 2012-03-24 MED ORDER — TECHNETIUM TC 99M SESTAMIBI GENERIC - CARDIOLITE
10.0000 | Freq: Once | INTRAVENOUS | Status: AC | PRN
  Administered 2012-03-24: 10 via INTRAVENOUS

## 2012-03-24 MED ORDER — TECHNETIUM TC 99M SESTAMIBI GENERIC - CARDIOLITE: 10.0000 | Freq: Once | INTRAVENOUS | Status: DC | PRN

## 2012-03-24 NOTE — Discharge Summary (Signed)
Family Medicine Teaching [facility] Discharge Summary  Patient name: Ellen Moore Medical record number: [id number] Date of birth: 04-Apr-1946 Age: 66 y.o. Gender: female Date of Admission: 03/22/2012  Date of Discharge: 03/24/2012 Admitting Physician: Carney Living, MD  Primary Care Provider: [facility], Marylene Land, MD  Indication for Hospitalization: Chest pain, Shortness of breath Discharge Diagnoses:  1. Stable Angina  Consultations: Cardiology, Rinaldo Cloud, MD  Significant Labs and Imaging:  Troponins negative x3  EKGs: (11/4) - NSR with T-wave inversions V4-V6. No ST changes. (11/5) - Sinus arrhythmia, rate 73, with non-specific T-wave flattening. (11/6) - NSR, w/ No ST or T wave changes    Chest X-ray Port 1 View Findings: Portable semi upright AP view. Lung volumes are within  normal limits. Rightward deviation of the trachea at the thoracic  inlet with increased density at the left thoracic inlet suggestive  of goiter. Other mediastinal contours are within normal limits. No  pneumothorax or pulmonary edema. No pleural effusion or confluent  pulmonary opacity.  IMPRESSION:  1. Left thyroid mass/goiter suspected.  2. No acute cardiopulmonary abnormality.  NM Myocar Multi w/ Spect w/ Wall Motion / EF (11/6) Findings: There is no fixed or reversible perfusion defect.  Calculated ejection fraction is 62%. End-systolic volume is 28 ml.  End-diastolic volume is 74 ml. Wall motion is normal. Thickening  is normal.  IMPRESSION:  No fixed or reversible perfusion defects. Ejection fraction of  62%.  Procedures: 11/6: Nuclear stress test: normal  Brief Hospital Course:  Ellen Moore 66 yo F who is visiting from [place] (only in [place] for 1 more month) w/ hx HTN, who presents with chronic chest pain of 5 years w/ recent 3 day worsening of exertional CP, concerning for unstable angina.    1. Chest Pain (POA) - Presented with recent hx of CP and SoB worsening on reduced  exertion, improved with rest w/ questionable improvement to NTG. Troponins negative x3, initial EKG w/ T-wave inversions lateral leads V4-V6, resolved on repeat EKG (11/6 NSR, no ST/T changes), CXR negative. While in hospital, patient's CP improved to minimal CP and SoB. Cardiology was consulted and Dr. Sharyn Lull was concerned for angina. Nuclear stress test performed today (03/24/12) showed no fixed or reversible perfusion defect w/ EF 62%. Risk stratification labs showed LDL 111, HgA1c 5.9%. Treated with ASA 81mg  daily, Metoprolol 12.5 BID, Atorvastatin 40mg  daily.   2. HTN (POA) - Elevated BP on admission 170/85. Started on Metoprolol 12.5 BID to lower BP and reduce demand on heart. Pressures have been mostly well controlled during hospital stay with systolic 120-130s. Plan to continue Metoprolol at home and re-start home Lisinopril 5mg  with outpt follow-up to consider increasing dose to 10mg  if BP still elevated.  3. L Thyroid Mass/Goiter (POA) - Mass visualized on CXR (11/4). Patient denies any specific complaints, or subjective airway obstruction symptoms. No prior CXR to compare to. TSH normal 0.356. No abnormal findings on exam. Would recommend outpatient follow-up for work-up and to consider possible iodine deficiency given hx of living in [place].    Discharge Medications:    Medication List     As of 03/24/2012  1:29 PM    ASK your doctor about these medications         aspirin 81 MG EC tablet   Take 1 tablet (81 mg total) by mouth daily. Swallow whole.      lisinopril 5 MG tablet   Commonly known as: PRINIVIL,ZESTRIL   Take 1 tablet (5 mg total)  by mouth daily.      nitroGLYCERIN 0.4 MG SL tablet   Commonly known as: NITROSTAT   Place 1 tablet (0.4 mg total) under the tongue every 5 (five) minutes as needed for chest pain.       Issues for Follow Up:   1. Hypertension Management - follow-up outpt to re-check BP and determine appropriate regimen. Currently on Metoprolol 12.5 BID  and Lisinopril 5mg  daily. Possible need to increase Lisinopril to 10mg .  2. L-Thyroid Mass/Goiter - possible follow-up outpt to further work-up etiology, can consider repeat imaging. Pt has no related complaints at this time.    Outstanding Results: none  Discharge Instructions: Please refer to Patient Instructions section of EMR for full details.  Patient was counseled important signs and symptoms that should prompt return to medical care, changes in medications, dietary instructions, activity restrictions, and follow up appointments.  Significant instructions noted below:   Discharge Condition: Improved. Currently stable with no active CP or SoB.  Marena Chancy, PGY-2 Family Medicine Resident

## 2012-03-24 NOTE — Progress Notes (Signed)
PCP note I have spoken with the patient's family, and I appreciate the care being provided to this patient by the FMTS, Dr. Sharyn Lull, and the other hospital staff.

## 2012-03-24 NOTE — Progress Notes (Signed)
Family Medicine Teaching Service Attending Note  I discussed patient Ellen Moore  with Dr. Gwenlyn Saran and reviewed their note for today.  I agree with their assessment and plan.

## 2012-03-24 NOTE — Progress Notes (Signed)
Family Medicine Teaching Service Daily Progress Note Service Page: [phone]  Patient Assessment: Ellen Moore 66 yo F visiting from [place] (4 months) w/ hx HTN, who presents with chronic chest pain of 5 years w/ recent 3 day worsening of CP. Currently CP has improved while in hospital. (Arabic speaking only, communicate via Daughter in room)   Subjective: Overnight events: None reported.  CP: Currently without any chest pain or shortness of breath. Patient reports that she has had minimal CP in the hospital, and that it was worse before when she walked long distances and exerted herself. NPO, planning to leave for Nuclear Stress Test at 0830.  R-Great Toe Numbness: Some numbness and burning pain in her R-great toe, started last night. Reports having hx of this same problem in the past. Unsure of what has worked in the past.   ROS: Denies any active CP, SoB, dizziness, lightheadedness, palpitations, abdominal pain, reflux symptoms, edema, decreased urine output. +R-great toe numbness/burning.  Objective: Temp:  [98.3 F (36.8 C)-99.1 F (37.3 C)] 99 F (37.2 C) (11/06 0827) Pulse Rate:  [68-81] 80  (11/06 0827) Resp:  [16-26] 26  (11/06 0827) BP: (122-137)/(63-87) 137/77 mmHg (11/06 0827) SpO2:  [96 %-100 %] 100 % (11/06 0827)  General appearance: alert, cooperative and no distress Eyes: conjunctivae/corneas clear. PERRL, EOM's intact. Fundi benign. Lungs: clear to auscultation bilaterally Heart: regular rate and rhythm, S1, S2 normal, no murmur, click, rub or gallop Abdomen: soft, non-tender; bowel sounds normal; no masses,  no organomegaly Extremities: extremities normal, atraumatic, no cyanosis or edema R-great toe non-tender to palpation, no edema, erythema Pulses: 2+ and symmetric Skin: Skin color, texture, turgor normal. No rashes or lesions  I have reviewed the patient's medications, labs, imaging, and diagnostic testing.  Notable results are summarized below.  CBC  BMET   Lab 03/22/12 1740  WBC 7.6  HGB 12.7  HCT 38.4  PLT 267    Lab 03/22/12 1740  NA 139  K 3.9  CL 102  CO2 25  BUN 14  CREATININE 0.63  GLUCOSE 111*  CALCIUM 9.3     Results for orders placed during the hospital encounter of 03/22/12 (from the past 48 hour(s))  HEMOGLOBIN A1C     Status: Abnormal   Collection Time   03/22/12  5:40 PM      Component Value Range Comment   Hemoglobin A1C 5.9 (*) <5.7 %    Mean Plasma Glucose 123 (*) <117 mg/dL   COMPREHENSIVE METABOLIC PANEL     Status: Abnormal   Collection Time   03/22/12  5:40 PM      Component Value Range Comment   Sodium 139  135 - 145 mEq/L    Potassium 3.9  3.5 - 5.1 mEq/L    Chloride 102  96 - 112 mEq/L    CO2 25  19 - 32 mEq/L    Glucose, Bld 111 (*) 70 - 99 mg/dL    BUN 14  6 - 23 mg/dL    Creatinine, Ser 1.30  0.50 - 1.10 mg/dL    Calcium 9.3  8.4 - 86.5 mg/dL    Total Protein 7.8  6.0 - 8.3 g/dL    Albumin 3.8  3.5 - 5.2 g/dL    AST 23  0 - 37 U/L    ALT 7  0 - 35 U/L    Alkaline Phosphatase 94  39 - 117 U/L    Total Bilirubin 0.3  0.3 - 1.2 mg/dL  GFR calc non Af Amer >90  >90 mL/min    GFR calc Af Amer >90  >90 mL/min   TSH     Status: Normal   Collection Time   03/22/12  5:40 PM      Component Value Range Comment   TSH 0.356  0.350 - 4.500 uIU/mL   TROPONIN I     Status: Normal   Collection Time   03/22/12  5:40 PM      Component Value Range Comment   Troponin I <0.30  <0.30 ng/mL   CBC     Status: Normal   Collection Time   03/22/12  5:40 PM      Component Value Range Comment   WBC 7.6  4.0 - 10.5 K/uL    RBC 4.31  3.87 - 5.11 MIL/uL    Hemoglobin 12.7  12.0 - 15.0 g/dL    HCT 16.1  09.6 - 04.5 %    MCV 89.1  78.0 - 100.0 fL    MCH 29.5  26.0 - 34.0 pg    MCHC 33.1  30.0 - 36.0 g/dL    RDW 40.9  81.1 - 91.4 %    Platelets 267  150 - 400 K/uL   LIPASE, BLOOD     Status: Normal   Collection Time   03/22/12  5:40 PM      Component Value Range Comment   Lipase 24  11 - 59 U/L   MRSA  PCR SCREENING     Status: Normal   Collection Time   03/22/12  5:42 PM      Component Value Range Comment   MRSA by PCR NEGATIVE  NEGATIVE   TROPONIN I     Status: Normal   Collection Time   03/22/12 10:53 PM      Component Value Range Comment   Troponin I <0.30  <0.30 ng/mL   TROPONIN I     Status: Normal   Collection Time   03/23/12  5:50 AM      Component Value Range Comment   Troponin I <0.30  <0.30 ng/mL   LIPID PANEL     Status: Abnormal   Collection Time   03/23/12  5:50 AM      Component Value Range Comment   Cholesterol 181  0 - 200 mg/dL    Triglycerides 782  <956 mg/dL    HDL 42  >21 mg/dL    Total CHOL/HDL Ratio 4.3      VLDL 28  0 - 40 mg/dL    LDL Cholesterol 308 (*) 0 - 99 mg/dL     Imaging/Diagnostic Tests:  EKG (11/6): normal EKG, normal sinus rhythm, No ST or T wave changes. Improved from last EKG (11/4 and 11/5) w/ non-specific T-wave changes.  Nuclear Stress Test: 11/6 results pending  Dg Chest Port 1 View  03/22/2012 *RADIOLOGY REPORT* Clinical Data: 66 year old female chest pain. PORTABLE CHEST - 1 VIEW Comparison: None. Findings: Portable semi upright AP view. Lung volumes are within normal limits. Rightward deviation of the trachea at the thoracic inlet with increased density at the left thoracic inlet suggestive of goiter. Other mediastinal contours are within normal limits. No pneumothorax or pulmonary edema. No pleural effusion or confluent pulmonary opacity. IMPRESSION: 1. Left thyroid mass/goiter suspected. 2. No acute cardiopulmonary abnormality. Original Report Authenticated By: Erskine Speed, M.D.   Medications:  Scheduled:  .  aspirin EC  81 mg  Oral  Daily   .  enoxaparin (LOVENOX) injection  40 mg  Subcutaneous  Q24H   .  metoprolol tartrate  12.5 mg  Oral  BID   .  sodium chloride  3 mL  Intravenous  Q12H    ZOX:WRUEAV chloride, acetaminophen, ondansetron (ZOFRAN) IV, sodium chloride   Assessment/Plan: Nanci Pina 66 yo F w/ hx HTN, who  presents with chronic chest pain of 5 years w/ recent 3 day worsening of exertional CP, concerning for unstable angina.   1. Chest Pain (POA) - Likely Stable Angina vs. Possible Unstable Angina - Stable. No active CP. Pre-test probability likelihood of CAD - Atypical CP (54%) - Typical CP (91%), +Substernal, +Worse with exertion, +/- relief w/ NTG. Likelihood of ACS - Intermediate Risk due to +CP, T-wave inversions >18mm, w/ normal Troponin. Risk stratification: LDL 111, HgA1c 5.9% (pre-DM).  - Troponin (negative x3)  - EKG (11/6) NSR no ST/T changes. Improvement over recent EKG w/ non-specific T-wave changes. Plan:  - continue ASA 81mg  daily  - continue Metoprolol 12.5 BID - continue Atorvastatin 40mg  qhs  - c/s Cardiology (Dr. Sharyn Lull) - evaluation for possible CAD. Perform Nuclear Stress Test today (11/6) - possible plan to discharge to home if stress test is negative  2. HTN (POA)  - Stable. Well-controlled. BP on admission 156/82. Improved to 120-130s/60-70s  - continue Metoprolol 12.5 BID   3. Possible L Thyroid Mass/Goiter (POA)-Mass visualized on CXR (11/4)  - Stable. no reported hx of thyroid disease or subjective airway obstructive symptoms. -consider outpatient workup. Possible iodine deficiency due to living in [place]? - TSH normal 0.356  4. FEN  Fluids -  Electrolytes - no abnormalities  Nutrition - currently NPO pending Nuclear Stress test PRN - Zofran 4mg  IM nausea, Tylenol 650mg  mild pain   5. Prophylaxis  VTE - Lovenox 40mg  SQ daily  LOS: 2 days   Disposition: Patient stable and improved. Pending results of nuclear stress test pt will likely be discharged to home today.  Sofie Hartigan, Med Student 03/24/2012, 9:01 AM  Agree with Sofie Hartigan' assessment and plan. Briefly,  Physical Exam: General: no acute distress HEENT: PERRLA, EOMI CV: s1s2, rrr, no murmur Pulm: clear to auscultation, no increased work of breathing Abd: present bowel sounds, non  tender, non distended Extr: no edema  A/P: 66 yo female with poorly controled hypertension, who presents with acute worsening of chronic chest pain 1. Chest pain: stable vs atypical chest pain. aggravated with exertion, toponin: negative without any acute changes on EKG. Chest pain free since admission - follow up stress test  - continue ASA, metoprolol and statin 2. Hypertension: stable in hospital. On lisinopril at home - continue to monitor 3. 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 Patient asymptomatic  - follow up as outpatient.  - tsh wnl  Fen/Gi:  advance diet after stress test PPx: lovenox Dispo: pending stress test results  Marena Chancy, PGY-2 Family Medicine Resident [phone]

## 2012-03-24 NOTE — Progress Notes (Signed)
Subjective:  Patient denies any chest pain or shortness of breath seen in nuclear department. Underwent nuclear stress test tolerated well  Objective:  Vital Signs in the last 24 hours: Temp:  [98.3 F (36.8 C)-99.1 F (37.3 C)] 99 F (37.2 C) (11/06 0827) Pulse Rate:  [68-115] 95  (11/06 1108) Resp:  [17-26] 26  (11/06 0827) BP: (122-167)/(63-84) 150/83 mmHg (11/06 1108) SpO2:  [96 %-100 %] 100 % (11/06 0827)  Intake/Output from previous day: 11/05 0701 - 11/06 0700 In: 393 [P.O.:390; I.V.:3] Out: -  Intake/Output from this shift:    Physical Exam: Neck: no adenopathy, no carotid bruit, no JVD and supple, symmetrical, trachea midline Lungs: clear to auscultation bilaterally Heart: regular rate and rhythm, S1, S2 normal, no murmur, click, rub or gallop Abdomen: soft, non-tender; bowel sounds normal; no masses,  no organomegaly Extremities: extremities normal, atraumatic, no cyanosis or edema  Lab Results:  Basename 03/22/12 1740  WBC 7.6  HGB 12.7  PLT 267    Basename 03/22/12 1740  NA 139  K 3.9  CL 102  CO2 25  GLUCOSE 111*  BUN 14  CREATININE 0.63    Basename 03/23/12 0550 03/22/12 2253  TROPONINI <0.30 <0.30   Hepatic Function Panel  Basename 03/22/12 1740  PROT 7.8  ALBUMIN 3.8  AST 23  ALT 7  ALKPHOS 94  BILITOT 0.3  BILIDIR --  IBILI --    Basename 03/23/12 0550  CHOL 181   No results found for this basename: PROTIME in the last 72 hours  Imaging: Imaging results have been reviewed and Nm Myocar Multi W/spect W/wall Motion / Ef  03/24/2012  *RADIOLOGY REPORT*  Clinical Data:  Coronary risk factors.  Chest pain.  Hypertension. Headache and nausea.  MYOCARDIAL IMAGING WITH SPECT (REST AND PHARMACOLOGIC-STRESS) GATED LEFT VENTRICULAR WALL MOTION STUDY LEFT VENTRICULAR EJECTION FRACTION  Technique:  Standard myocardial SPECT imaging was performed after resting intravenous injection of 10 mCi Tc-30m sestamibi. Subsequently, intravenous infusion  of Lexiscan was performed under the supervision of the Cardiology staff.  At peak effect of the drug, 30 mCi Tc-27m sestamibi was injected intravenously and standard myocardial SPECT imaging was performed.  Quantitative gated imaging was also performed to evaluate left ventricular wall motion and estimate left ventricular ejection fraction.  Comparison:  Chest radiograph 03/22/2012.  Findings:  There is no fixed or reversible perfusion defect. Calculated ejection fraction is 62%.  End-systolic volume is 28 ml. End-diastolic volume is 74 ml.  Wall motion is normal.  Thickening is normal.  IMPRESSION: No fixed or reversible perfusion defects.  Ejection fraction of 62%.   Original Report Authenticated By: Andreas Newport, M.D.    Dg Chest Port 1 View  03/22/2012  *RADIOLOGY REPORT*  Clinical Data: 66 year old female chest pain.  PORTABLE CHEST - 1 VIEW  Comparison: None.  Findings: Portable semi upright AP view.  Lung volumes are within normal limits.  Rightward deviation of the trachea at the thoracic inlet with increased density at the left thoracic inlet suggestive of goiter. Other mediastinal contours are within normal limits.  No pneumothorax or pulmonary edema.  No pleural effusion or confluent pulmonary opacity.  IMPRESSION:  1.  Left thyroid mass/goiter suspected. 2. No acute cardiopulmonary abnormality.   Original Report Authenticated By: Erskine Speed, M.D.     Cardiac Studies:  Assessment/Plan:  Status post chest pain MI ruled out  Negative nuclear stress test Hypertension  Hypercholesteremia  Thyroid nodule Plan Okay to discharge from cardiac point of view  Followup with me 2 weeks May need GI evaluation  LOS: 2 days    Ellen Moore 03/24/2012, 5:44 PM

## 2012-03-25 ENCOUNTER — Telehealth: Payer: Self-pay | Admitting: *Deleted

## 2012-03-25 MED ORDER — METOPROLOL TARTRATE 12.5 MG HALF TABLET: 12.5000 mg | ORAL_TABLET | Freq: Two times a day (BID) | ORAL | Status: DC

## 2012-03-25 MED ORDER — LISINOPRIL 5 MG PO TABS: 5.0000 mg | ORAL_TABLET | Freq: Every day | ORAL | Status: DC

## 2012-03-25 MED ORDER — ATORVASTATIN CALCIUM 40 MG PO TABS: 40.0000 mg | ORAL_TABLET | Freq: Every day | ORAL | Status: DC

## 2012-03-25 NOTE — Telephone Encounter (Addendum)
Received call from pharmacy stating they have a discount plan for Lipitor and Lopressor for $11.99.  However they need an RX for 90 day supply. Alos patient wants refill of lisinopril. Will forward to MD.

## 2012-03-26 NOTE — Discharge Summary (Signed)
I have reviewed this discharge summary and agree.    

## 2012-03-29 ENCOUNTER — Ambulatory Visit (INDEPENDENT_AMBULATORY_CARE_PROVIDER_SITE_OTHER): Payer: Self-pay | Admitting: Family Medicine

## 2012-03-29 ENCOUNTER — Encounter: Payer: Self-pay | Admitting: Family Medicine

## 2012-03-29 VITALS — BP 147/84 | HR 71 | Temp 98.4°F | Ht 65.5 in | Wt 185.0 lb

## 2012-03-29 DIAGNOSIS — R0789 Other chest pain: Secondary | ICD-10-CM

## 2012-03-29 DIAGNOSIS — I10 Essential (primary) hypertension: Secondary | ICD-10-CM

## 2012-03-29 DIAGNOSIS — E049 Nontoxic goiter, unspecified: Secondary | ICD-10-CM | POA: Insufficient documentation

## 2012-03-29 MED ORDER — ASPIRIN 81 MG PO TBEC: 81.0000 mg | DELAYED_RELEASE_TABLET | Freq: Every day | ORAL | Status: AC

## 2012-03-29 MED ORDER — PRAVASTATIN SODIUM 20 MG PO TABS: 20.0000 mg | ORAL_TABLET | Freq: Every day | ORAL | Status: DC

## 2012-03-29 NOTE — Progress Notes (Signed)
Subjective:     Patient ID: Ellen Moore, female   DOB: 1946/03/16, 66 y.o.   MRN: [id number]  HPI # Hospital follow-up for chest pain, ruled out CAD with nuclear stress test  Ms. Ellen Moore is a 66 yo female with past medical history of HTN who presents for a hospital follow-up following admission for chest pain rule out. She presented to the hospital with chest pain and shortness of breath worsening on reduced exertion improved with rest w/ questionable improvement to NTG. Troponins, CXr were both negative for acute process. Initial EKG showed T wave inversions in lateral leads V4-V6, which resolved on repeat EKG. Nuclear stress test was also negative with EF 62%. She was discharged on ASA 81 mg qd, metoprolol 12.5 bid, and atorvastatin 40 mg qd.   Since discharge from the hospital, pt denies any chest pain. She has not needed to use any sublingual nitroglycerin. Pt has not been taking 81 mg aspirin and was unable to afford the atorvastatin so has not been taking that either. Denies any concerns/complaints at this time. Her son thinks that taking the statin is helping her symptoms (?).   # A possible left thyroid mass/goiter was noted on CXR in the hospital; however TSH was found to be WNL.   Review of Systems As noted above in HPI.    Objective:   Physical Exam BP 147/84  Pulse 71  Temp 98.4 F (36.9 C) (Oral)  Ht 5' 5.5" (1.664 m)  Wt 185 lb (83.915 kg)  BMI 30.32 kg/m2 General appearance: alert, cooperative and no distress Neck: no adenopathy, no carotid bruit, no JVD, supple, symmetrical, trachea midline and no obvious thryomegaly Lungs: clear to auscultation bilaterally Heart: regular rate and rhythm, S1, S2 normal, no murmur, click, rub or gallop Extremities: extremities normal, atraumatic, no cyanosis or edema Pulses: 2+ and symmetric Skin: Skin color, texture, turgor normal. No rashes or lesions Neurologic: alert and oriented, no obvious neurological deficits    Assessment:    Chest pain HTN CXR abnormality - L thyroid mass/goiter    Plan:     Patient seen with assistance of medical student Maretta Los.

## 2012-03-29 NOTE — Assessment & Plan Note (Signed)
Resolved.  -Chest pain not likely to be cardiac in etiology due to negative workup during hospitalization.  -Pt was instructed to schedule hospital follow up with Dr. Sharyn Lull (cardiology) and call today.  -Continue aspirin 81 mg qd for now.  -Switched prescription from atorvastatin to pravastatin, which is more affordable for her.

## 2012-03-29 NOTE — Patient Instructions (Addendum)
1. Call Dr. Sharyn Lull (heart doctor) today and make a hospital follow-up appointment 2. START pravastatin (cholesterol medicine) and aspirin 3. Follow-up with Dr. Madolyn Frieze in 2 months

## 2012-03-29 NOTE — Assessment & Plan Note (Signed)
Fair control today.  -Continue lisinopril 5 mg qd. Repeat BP measurement in office today was 138/70. Will recheck BP on next visit. -Follow up with PCP in 2 months.

## 2012-03-29 NOTE — Assessment & Plan Note (Signed)
Asymptomatic. -TSH normal with no symptoms of hypo/hyperthyroidism. Will continue to monitor patient for thyroid issues and will recheck a TSH in the future (6-12 months or sooner if symptom).

## 2013-05-30 ENCOUNTER — Telehealth: Payer: Self-pay | Admitting: Family Medicine

## 2013-05-30 MED ORDER — LISINOPRIL 5 MG PO TABS: 5.0000 mg | ORAL_TABLET | Freq: Every day | ORAL | Status: DC

## 2013-05-30 MED ORDER — METOPROLOL TARTRATE 12.5 MG HALF TABLET: 12.5000 mg | ORAL_TABLET | Freq: Two times a day (BID) | ORAL | Status: DC

## 2013-05-30 NOTE — Telephone Encounter (Signed)
I am covering Dr. Lucienne Minkshekkekandam's box today while she is away. Received refill request for metoprolol and lisinopril. Noted that pt has not been seen here in our clinic since November 2013. She did receive a full year's worth of medicines at that visit so it is reasonable for her to have requested a refill at this time. Will refill x 1 month but [facility] blue team, please call pt and let her know she needs to schedule an office visit with Dr. Karie Schwalbe to meet her new PCP and follow up.  Thanks! Latrelle DodrillBrittany J McIntyre, MD

## 2013-05-31 ENCOUNTER — Encounter: Payer: Self-pay | Admitting: *Deleted

## 2013-05-31 NOTE — Telephone Encounter (Signed)
Mailed letter as patient speaks arabic. Ellen Moore, Ellen Moore

## 2014-01-29 IMAGING — CR DG SACRUM/COCCYX 2+V
3 series · 3 of 3 positions shown · non-contrast
Comparison: None.

CLINICAL DATA: Left hip pain.

SACRUM AND COCCYX - 2+ VIEW

[t sacrum ap]
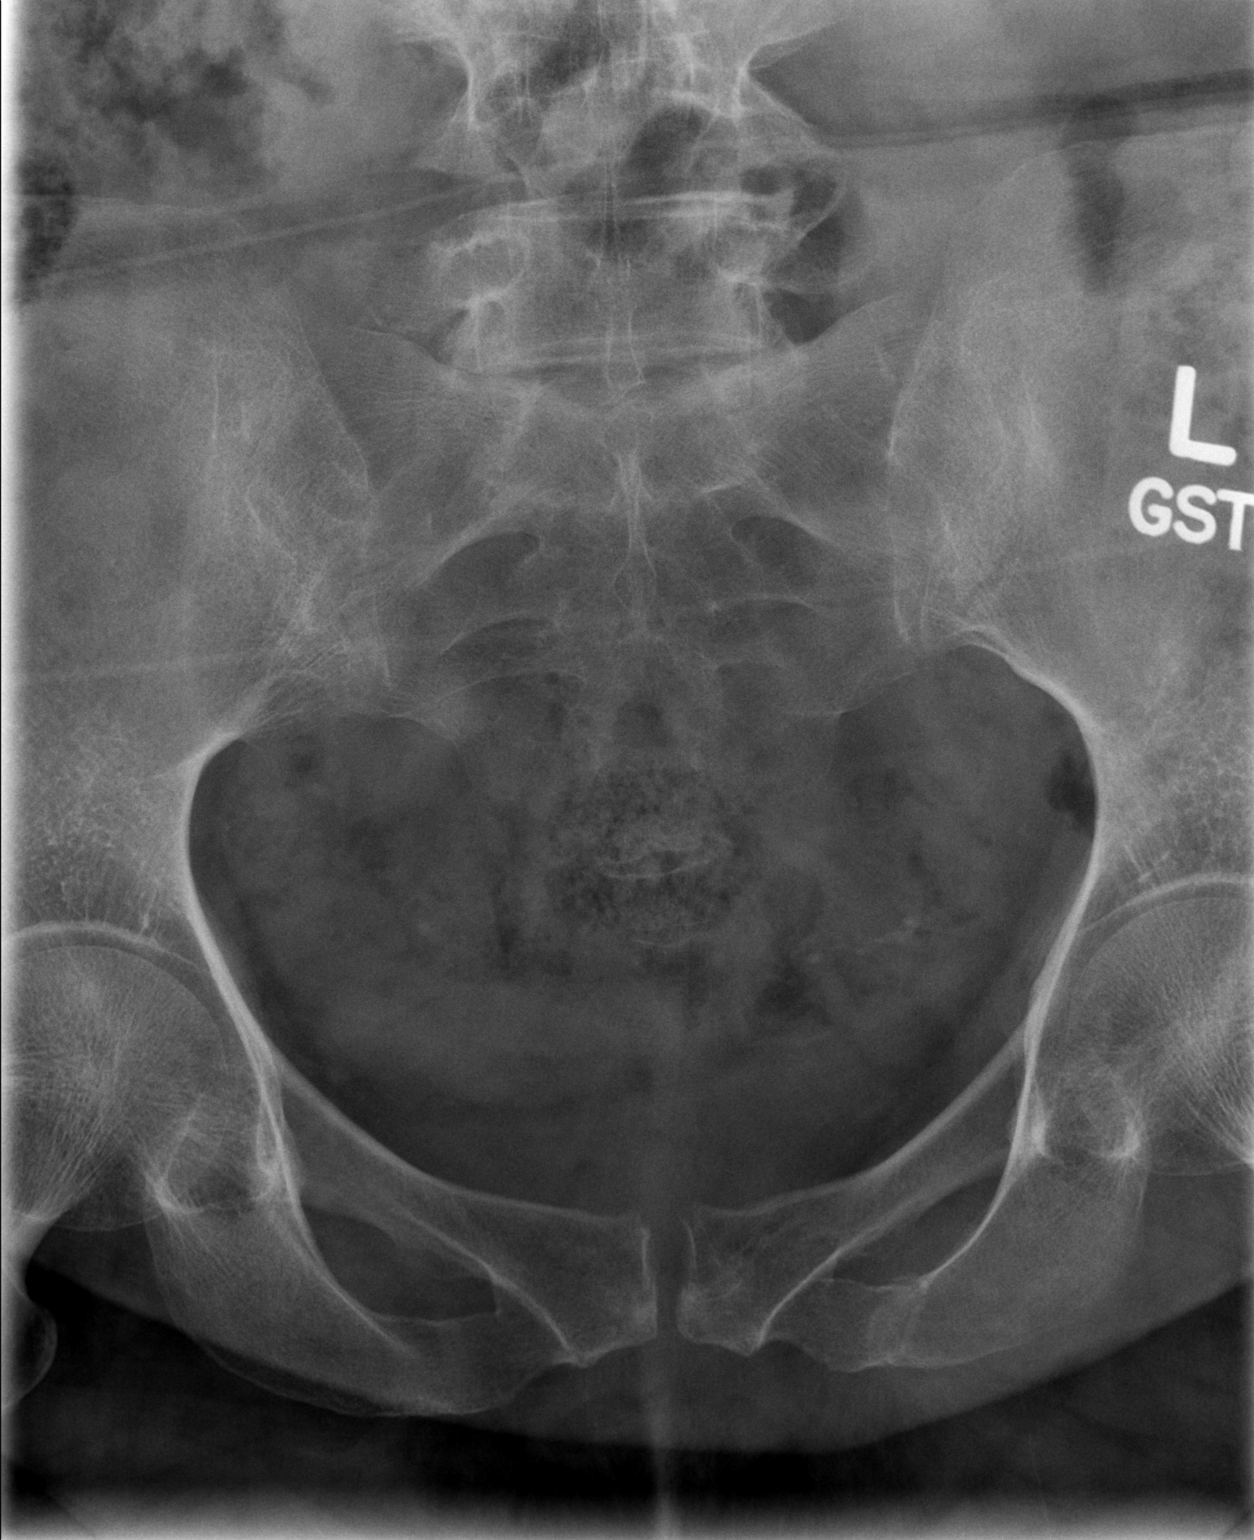

[t coccyx ap]
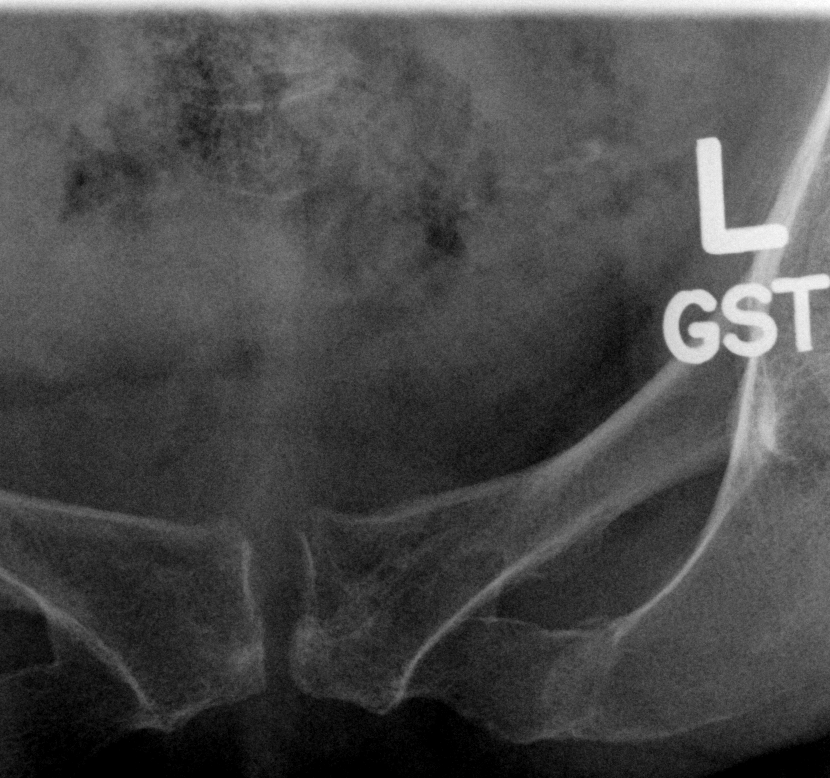

[t sacrum coccyx lat]
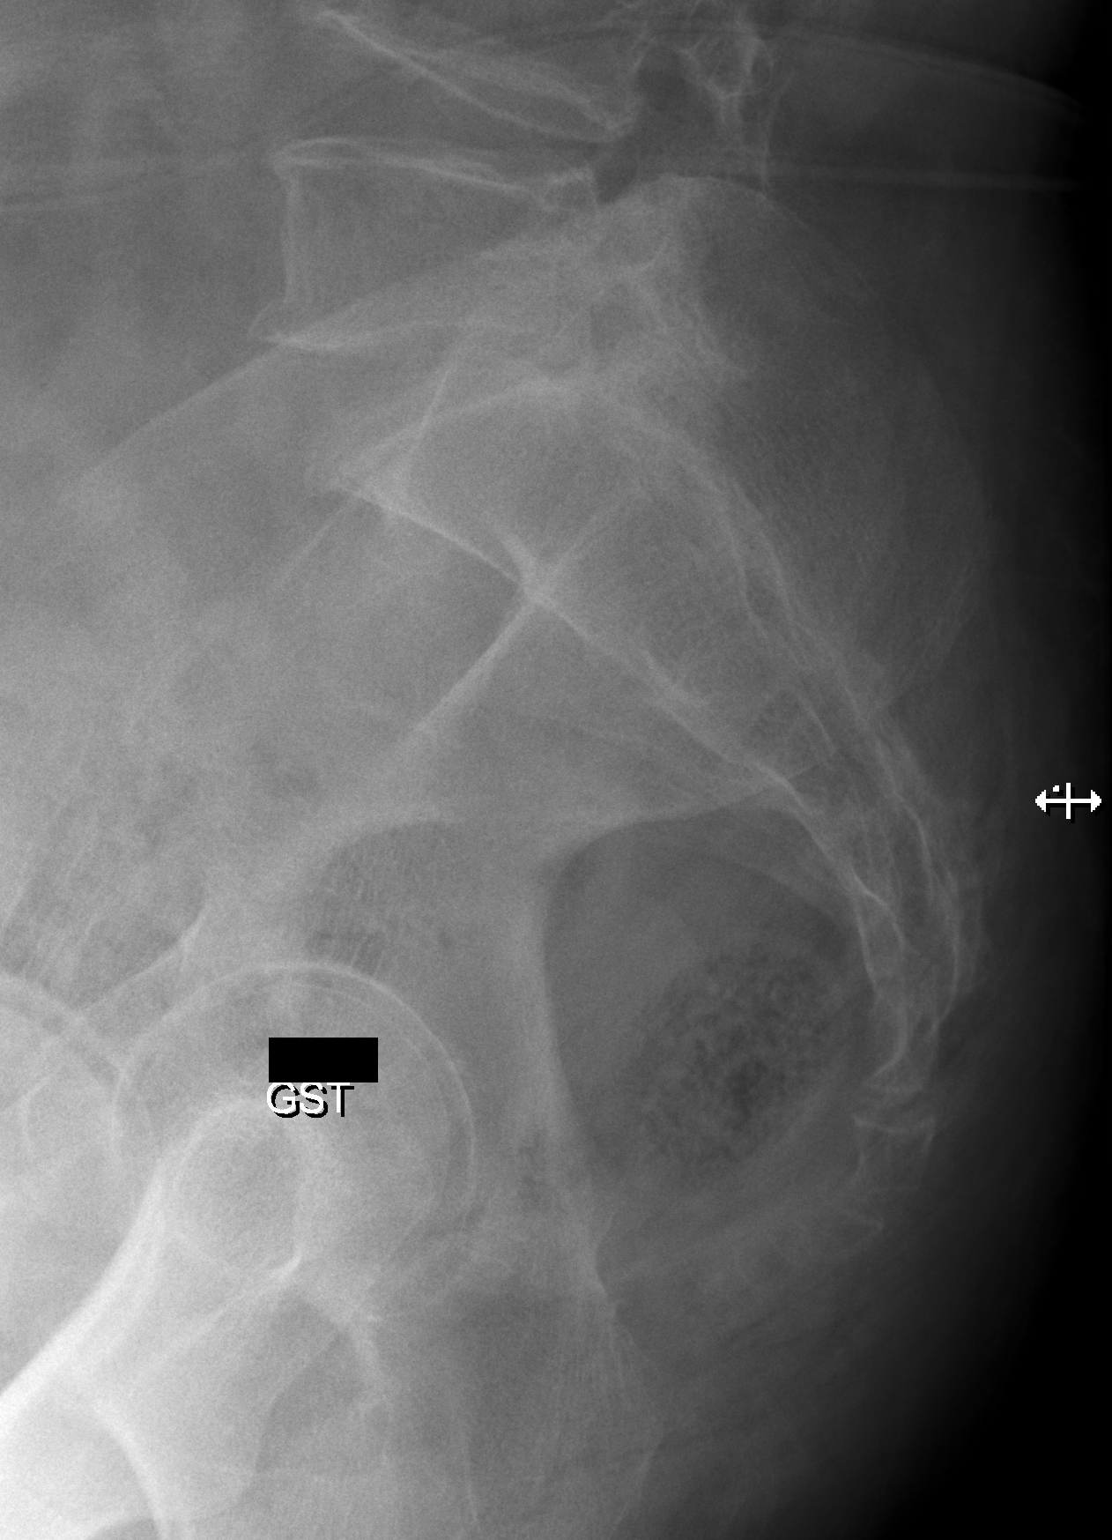

[3 of 3 positions shown; findings below may reference images not displayed]

FINDINGS: Left inferior pubic ramus fractures noted.  Cannot
exclude fracture of the left pubic body.  Possible transverse
fracture the lower sacrum noted.  CT the pelvis could be utilized
for further characterization.
IMPRESSION: 1.  Left inferior pubic ramus fracture.  Possible pubic body
fracture on the left.
2.  Equivocal fracture of the left inferior sacrum.  CT could be
utilized for further characterization if clinically warranted.

## 2014-07-06 ENCOUNTER — Encounter: Payer: Self-pay | Admitting: Family Medicine

## 2014-07-06 ENCOUNTER — Ambulatory Visit (INDEPENDENT_AMBULATORY_CARE_PROVIDER_SITE_OTHER): Payer: Self-pay | Admitting: Family Medicine

## 2014-07-06 VITALS — BP 180/92 | HR 90 | Temp 98.2°F | Ht 66.0 in | Wt 177.0 lb

## 2014-07-06 DIAGNOSIS — H578 Other specified disorders of eye and adnexa: Secondary | ICD-10-CM

## 2014-07-06 DIAGNOSIS — I10 Essential (primary) hypertension: Secondary | ICD-10-CM

## 2014-07-06 DIAGNOSIS — R6889 Other general symptoms and signs: Secondary | ICD-10-CM

## 2014-07-06 MED ORDER — OLOPATADINE HCL 0.1 % OP SOLN: 1.0000 [drp] | Freq: Two times a day (BID) | OPHTHALMIC | Status: AC

## 2014-07-06 MED ORDER — LISINOPRIL 5 MG PO TABS: 5.0000 mg | ORAL_TABLET | Freq: Every day | ORAL | Status: DC

## 2014-07-06 MED ORDER — PRAVASTATIN SODIUM 20 MG PO TABS: 20.0000 mg | ORAL_TABLET | Freq: Every day | ORAL | Status: DC

## 2014-07-06 MED ORDER — METOPROLOL TARTRATE 25 MG PO TABS: 12.5000 mg | ORAL_TABLET | Freq: Two times a day (BID) | ORAL | Status: DC

## 2014-07-06 MED ORDER — OLOPATADINE HCL 0.1 % OP SOLN: 1.0000 [drp] | Freq: Two times a day (BID) | OPHTHALMIC | Status: DC

## 2014-07-06 MED ORDER — METOPROLOL TARTRATE 25 MG PO TABS: 12.5000 mg | ORAL_TABLET | Freq: Two times a day (BID) | ORAL | Status: AC

## 2014-07-06 NOTE — Patient Instructions (Signed)
Good to meet you.  Blood pressure is high today because of being out of medicines.  I will send refill. Watch salt intake.  Try to get in more exercise. Please check 3 Blood Pressures at drug store and call me with these numbers. It would be good to get labwork done to check kidneys and cholesterol. Please find out from our lab the cost and let me know if you want to get these done here.  I will refill the cholesterol medicine.  The eyes are likely allergies. Take patanol eye drop and over the counter lubricating eye drops daily. Follow up in 2-3 weeks if not improving.  Follow up when I am next available to evaluate back and knee pain.  Best,  Leona SingletonMaria T Beaverton Antolin, MD

## 2014-07-06 NOTE — Progress Notes (Signed)
Patient ID: Nanci PinaFatima Goosby, female   DOB: 07/07/1945, 69 y.o.   MRN: [id number] Subjective:   CC: Bood pressure med refill, eye itching, dentist  HPI:   Daughter interprets  BP medication refill Takes lisinopril and metoprolol tartrate.  Takes daily until 1 week ago when they ran out of medication. She remembers on her own. No side effects from medication. Does not check at home. No chest pain or dyspnea, leg swelling, blurred vision, dizziness, or fainting. Gets minimal exercise with one time walking per week 15 minutes. Not much added salt. No tobacco expsoure. Takes pravastatin - not taking.  Eye itching Both eyes intermittently for a few days x >1 year Some blurred vision No pus/exudate No erythema No fevers No pain No difficulty moving eye No new product exposure  Dental visit Patient needs dentures and would like referral to dentist. However, she has no insurance. She has spoken with Britta MccreedyBarbara about [employer] but likely will not qualify because she is just visiting per their discussion. No current acute issue.  Review of Systems - Per HPI. Additionally, has some anterior leg pain and foot numbness  PMH: Reviewed Smoking status: Nonsmoker    Objective:  Physical Exam BP 180/92 mmHg  Pulse 90  Temp(Src) 98.2 F (36.8 C) (Oral)  Ht 5\' 6"  (1.676 m)  Wt 177 lb (80.287 kg)  BMI 28.58 kg/m2 GEN: NAD CV: RRR, II/VI systolic murmur PULM: CTAB, normal effort EXTR: No LE edema HEENT: Poor dentiition missing teeth. Neck supple, no JVD, no thyromeg PERRL, arcus senilis, mmm, eyes with No discharge, EOMI, no erythema, no proptosis, PERRL, no discharge, no tenderness  Assessment:     Nanci PinaFatima Tukes is a 69 y.o. female here for BP medication refill, eye itching, and to discuss dentist.    Plan:     # See problem list and after visit summary for problem-specific plans. - List of dentists that take Medicaid provided. Discussed that patient would need to call around, and  notified of occasional free dental clinic they could look into.  - ED if any acute issue/ pain/fevers develops  # Health Maintenance:  - On follow up will need daughter and pt to sign that patient can interpret.  Follow-up: Follow up in 2-3 weeks PRN lack of improvement of eye itching. - Follow up when I am next available to evaluate back and knee pain.   Leona SingletonMaria T Frankey Botting, MD [facility]

## 2014-07-07 DIAGNOSIS — H579 Unspecified disorder of eye and adnexa: Secondary | ICD-10-CM | POA: Insufficient documentation

## 2014-07-07 DIAGNOSIS — R6889 Other general symptoms and signs: Secondary | ICD-10-CM | POA: Insufficient documentation

## 2014-07-07 NOTE — Assessment & Plan Note (Signed)
Likely mild dry eyes vs allergic conjunctivitis (less likely as no findings on exam). - Patanol eye drop and OTC lubricating eye drops daily. - F/u 2-3 weeks if not improivng.

## 2014-07-07 NOTE — Assessment & Plan Note (Signed)
BP elevated today, but has been out of medication 1 week. No chest pain or dyspnea.  - Refilled metoprolol.  - Discussed increasing exercise - walk more. Continue to watch salt intake. - Needs CMET but would like to get this a later time after calling lab to discuss cost. At that time, check cholesterol too. Refilled pravastatin. - Call in1 month with 3 BP values.

## 2014-07-18 ENCOUNTER — Other Ambulatory Visit (INDEPENDENT_AMBULATORY_CARE_PROVIDER_SITE_OTHER): Payer: Self-pay

## 2014-07-18 DIAGNOSIS — I10 Essential (primary) hypertension: Secondary | ICD-10-CM

## 2014-07-18 LAB — COMPLETE METABOLIC PANEL WITH GFR
ALBUMIN: 4.2 g/dL (ref 3.5–5.2)
ALT: <8 U/L (ref 0–35)
AST: 18 U/L (ref 0–37)
Alkaline Phosphatase: 76 U/L (ref 39–117)
BUN: 13 mg/dL (ref 6–23)
CALCIUM: 9.3 mg/dL (ref 8.4–10.5)
CHLORIDE: 103 meq/L (ref 96–112)
CO2: 28 mEq/L (ref 19–32)
Creat: 0.73 mg/dL (ref 0.50–1.10)
GFR, EST NON AFRICAN AMERICAN: 85 mL/min
GFR, Est African American: >89 mL/min
GLUCOSE: 107 mg/dL — AB (ref 70–99)
POTASSIUM: 4 meq/L (ref 3.5–5.3)
SODIUM: 140 meq/L (ref 135–145)
TOTAL PROTEIN: 6.9 g/dL (ref 6.0–8.3)
Total Bilirubin: 0.5 mg/dL (ref 0.2–1.2)

## 2014-07-18 LAB — LIPID PANEL
CHOL/HDL RATIO: 4.1 ratio
CHOLESTEROL: 196 mg/dL (ref 0–200)
HDL: 48 mg/dL (ref >=46)
LDL Cholesterol: 116 mg/dL — ABNORMAL HIGH (ref 0–99)
TRIGLYCERIDES: 161 mg/dL — AB (ref <150)
VLDL: 32 mg/dL (ref 0–40)

## 2014-07-18 NOTE — Progress Notes (Signed)
[facility] phlebotomist drew:  CMET, LIPID 

## 2014-07-22 ENCOUNTER — Telehealth: Payer: Self-pay | Admitting: Family Medicine

## 2014-07-22 NOTE — Telephone Encounter (Addendum)
Called to discuss labwork, left message for family to call back.  Please let them know CMET is normal.  Lipid panel puts her in 10 year ASCVD risk 19.9%, which would suggest she would benefit from high intensity statin. She currently has no insurance so this will be a challenge.  - [facility] has lovastatin 20mg  at $10 for 90 tablets (1.5 months at 40mg  dose moderate intensity) - [facility] has atorvastatin 20mg  at $27 for 100 tablets (>1.5 mo at 40mg  dose high intensity). This is preferable but will need to discuss pricing with family. Please let me know what she prefers.   Any of these have risk of muscle pain that is rare.   Leona SingletonMaria T Tammy Ericsson, MD

## 2014-08-03 ENCOUNTER — Ambulatory Visit (INDEPENDENT_AMBULATORY_CARE_PROVIDER_SITE_OTHER): Payer: Self-pay | Admitting: Family Medicine

## 2014-08-03 ENCOUNTER — Encounter: Payer: Self-pay | Admitting: Family Medicine

## 2014-08-03 VITALS — BP 153/88 | HR 71 | Temp 98.5°F | Ht 66.0 in | Wt 177.0 lb

## 2014-08-03 DIAGNOSIS — E785 Hyperlipidemia, unspecified: Secondary | ICD-10-CM

## 2014-08-03 DIAGNOSIS — M25561 Pain in right knee: Secondary | ICD-10-CM

## 2014-08-03 DIAGNOSIS — I10 Essential (primary) hypertension: Secondary | ICD-10-CM

## 2014-08-03 MED ORDER — ATORVASTATIN CALCIUM 40 MG PO TABS: 40.0000 mg | ORAL_TABLET | Freq: Every day | ORAL | Status: DC

## 2014-08-03 MED ORDER — ATORVASTATIN CALCIUM 40 MG PO TABS: 40.0000 mg | ORAL_TABLET | Freq: Every day | ORAL | Status: AC

## 2014-08-03 MED ORDER — LISINOPRIL 10 MG PO TABS: 10.0000 mg | ORAL_TABLET | Freq: Every day | ORAL | Status: AC

## 2014-08-03 NOTE — Assessment & Plan Note (Signed)
Ten-year ASCVD risk rated than 10%. -Prescription drug discount card through [facility] discount Rx "easy drug card" printed for patient to receive 3 months atorvastatin 40 mg at $29 -Recheck in one year

## 2014-08-03 NOTE — Patient Instructions (Addendum)
Please purchase atorvastatin - with the card, it should be $29.46 for 3 months of atorvastatin 40mg . Let me know about 2 weeks before this runs out so we can see if we can use that same discount card again. Please also increase lisinopril to 10 mg daily. Come back for follow-up in about one month so that we can recheck your blood pressure. Continue checking blood pressures at home and call if any get below 120/80 or above 150/90.  For your right knee, this is likely arthritis. Use heat or ice, whichever feels more comfortable. Use compression sleeve on the knee except when sleeping. Use Tylenol as needed, avoiding any doses of 3 g daily. Come back in about one month so we can see how this is doing. Work on quadriceps strengthening that we discussed.  Best!  Leona SingletonMaria T Shrihan Putt, MD

## 2014-08-03 NOTE — Assessment & Plan Note (Signed)
BP not within goal for age. Goal is less than 150/90. Currently taking metoprolol and lisinopril. -Increase lisinopril to 10 mg daily. -Continue checking at home and call with any values under 120/80 or over 150/90 -1 month for follow-up of blood pressure and BMET. Still with no insurance, we'll need to discuss cost before ordering.

## 2014-08-03 NOTE — Assessment & Plan Note (Signed)
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 Will hold off on x-ray due to no insurance. -Conservative treatment with heat, ice, compression sleeve, Tylenol when necessary. -Quad exercises discussed -Follow-up 1 month at which time can consider injection if no improvement

## 2014-08-03 NOTE — Progress Notes (Signed)
Patient ID: Ellen Moore, female   DOB: 03/10/1946, 69 y.o.   MRN: [id number] Subjective:   CC: Follow-up hypertension, right knee pain  HPI:   Interpreting to patient's daughter.  Follow-up hypertension Patient reports blood pressures have been 160s over 70s at home. She is taking metoprolol and lisinopril daily, though she did not take today in preparation for the appointment. Denies medication side effects Denies chest pain, shortness of breath, leg swelling, or dizziness. Only exercise is walking, not daily, during ADLs. No tobacco use history  Right knee pain Medial right knee, 5-6 years. Has never had an injection. No radiation, injury, swelling, erythema, or falls. Reports decreased ability to flex completely. Pain is 6-7/10 and intermittent. Has tried Tylenol 500 mg daily which helps. Notes mild swelling on occasion. Has not used ice or heat.  Hyperlipidemia Reviewed lab results with patient and daughter. She is interested in atorvastatin 3 month rx via [facility] discount Rx "easy drug card."  Review of Systems - Per HPI.   PMH: Goiter, history of hip fracture, hypertension, low back pain, noncardiac chest pain; no h/o MI or stroke Smoking status: Never smoker    Objective:  Physical Exam BP 153/88 mmHg  Pulse 71  Temp(Src) 98.5 F (36.9 C) (Oral)  Ht 5\' 6"  (1.676 m)  Wt 177 lb (80.287 kg)  BMI 28.58 kg/m2 GEN: NAD Cardiovascular: Regular rate and rhythm, no murmurs rubs or gallops, 2+ bilateral radial pulses Pulmonary: Clear to auscultation bilaterally, normal effort Extremity: No lower extremity edema or calf tenderness Right knee mildly hypertrophied with medial joint line tenderness to palpation, crepitus under patella on knee extension, no erythema or swelling, range of motion 5 to 115, no laxity to varus or valgus strain, negative Lachman's and posterior drawer sign Full hip range of motion with no reproduced hip pain or greater trochanter tenderness    Assessment:      Ellen Moore is a 69 y.o. female here for f/u HTN and with right knee pain.    Plan:     # See problem list and after visit summary for problem-specific plans. -No insurance, states she did not qualify [employer] card, will message Ellen Moore for other options. -Form signed for daughter to be interpreter for patient.   Follow-up: Follow up in 1 month for recheck BP and f/u right knee pain.   Ellen SingletonMaria T Tashika Goodin, MD [facility]

## 2014-08-31 ENCOUNTER — Encounter: Payer: Self-pay | Admitting: Family Medicine

## 2014-08-31 ENCOUNTER — Ambulatory Visit (INDEPENDENT_AMBULATORY_CARE_PROVIDER_SITE_OTHER): Payer: Self-pay | Admitting: Family Medicine

## 2014-08-31 VITALS — BP 148/88 | HR 78 | Temp 98.1°F | Ht 66.0 in | Wt 177.2 lb

## 2014-08-31 DIAGNOSIS — H8113 Benign paroxysmal vertigo, bilateral: Secondary | ICD-10-CM

## 2014-08-31 DIAGNOSIS — R300 Dysuria: Secondary | ICD-10-CM

## 2014-08-31 DIAGNOSIS — R312 Other microscopic hematuria: Secondary | ICD-10-CM

## 2014-08-31 DIAGNOSIS — R3129 Other microscopic hematuria: Secondary | ICD-10-CM | POA: Insufficient documentation

## 2014-08-31 DIAGNOSIS — H8110 Benign paroxysmal vertigo, unspecified ear: Secondary | ICD-10-CM | POA: Insufficient documentation

## 2014-08-31 LAB — POCT UA - MICROSCOPIC ONLY

## 2014-08-31 LAB — POCT URINALYSIS DIPSTICK
Bilirubin, UA: NEGATIVE
GLUCOSE UA: NEGATIVE
Ketones, UA: NEGATIVE
Leukocytes, UA: NEGATIVE
NITRITE UA: NEGATIVE
PH UA: 5.5
Protein, UA: NEGATIVE
Spec Grav, UA: 1.025
UROBILINOGEN UA: 0.2

## 2014-08-31 NOTE — Assessment & Plan Note (Signed)
Dysuria today, no signs of infection on urinalysis Microscopic hematuria with 5-10 rbc's  Per hpf. Recommend repeat urinalysis and micro-next visit, consider referral for evaluation of microscopic  Persistent hematuria.

## 2014-08-31 NOTE — Patient Instructions (Addendum)
Great to meet you!  Try the epleys maneuvers for her dizziness  Please come back to see Dr. Karie Schwalbe in the next 2-4 weeks to follow up about this problem  Vertigo Vertigo means you feel like you are moving when you are not. Vertigo can make you feel like things around you are moving when they are not. This problem often goes away on its own.  HOME CARE   Follow your doctor's instructions.  Avoid driving.  Avoid using heavy machinery.  Avoid doing any activity that could be dangerous if you have a vertigo attack.  Tell your doctor if a medicine seems to cause your vertigo. GET HELP RIGHT AWAY IF:   Your medicines do not help or make you feel worse.  You have trouble talking or walking.  You feel weak or have trouble using your arms, hands, or legs.  You have bad headaches.  You keep feeling sick to your stomach (nauseous) or throwing up (vomiting).  Your vision changes.  A family member notices changes in your behavior.  Your problems get worse. MAKE SURE YOU:  Understand these instructions.  Will watch your condition.  Will get help right away if you are not doing well or get worse. Document Released: 02/12/2008 Document Revised: 07/28/2011 Document Reviewed: 11/21/2010 [facility] Patient Information 2015 [employer] [place]. This information is not intended to replace advice given to you by your health care provider. Make sure you discuss any questions you have with your health care provider.

## 2014-08-31 NOTE — Assessment & Plan Note (Signed)
Symptoms consistent with benign positional paroxysmal vertigo , slightly improving since initial onset  positive Dix-Hallpike maneuver in the clinic   neuro exam without any concerning findings   provide Epley maneuvers, as she is resolving discussed that she's better within a day and nothing to worry about these Follow-up very closely with PCP in 2-4 weeks.

## 2014-08-31 NOTE — Progress Notes (Signed)
Patient ID: Ellen Moore, female   DOB: 11/11/1945, 69 y.o.   MRN: [id number]    exam performed with the help of patient's daughter, according to last PCP note she has signed the paper stating that her daughter is a good Nurse, learning disabilitytranslator for her.  HPI  Patient presents today for  Dizziness  Dizziness  she explains she's had 2 days of persistent dizziness. It was severe one day ago that has improved slightly today.   She describes room spinning sensation with moving her head backwards or  Forwards.  She denies any difficulty hearing, muffled hearing, ear pain.  She is having some slight soreness of her right side of her throat.  She denies any weakness on one side of the body or other , difficulty speaking, discoordination, or difficulty walking. She's had no syncope.  she denies fevers, chills, and change in oral intake and appetite.  Dysuria describes 4 days of dysuria , no fevers or change in appetite. She denies hematuria.  ROS: Per HPI  Objective: BP 148/88 mmHg  Pulse 78  Temp(Src) 98.1 F (36.7 C) (Oral)  Ht 5\' 6"  (1.676 m)  Wt 177 lb 3.2 oz (80.377 kg)  BMI 28.61 kg/m2 Gen: NAD, alert, cooperative with exam HEENT: NCAT,  Oropharynx clear, TMs  WNL BL, MMM , positive Dix-Hallpike with looking to the right CV: RRR, good S1/S2, no murmur Ext: No edema, warm Neuro: Alert and oriented,  Strength and sensation intact5/5 in bilateral upper and lower extremities , cranial nerves II through XII intact , stooped gait  Assessment and plan:  BPPV (benign paroxysmal positional vertigo)  Symptoms consistent with benign positional paroxysmal vertigo , slightly improving since initial onset  positive Dix-Hallpike maneuver in the clinic   neuro exam without any concerning findings   provide Epley maneuvers, as she is resolving discussed that she's better within a day and nothing to worry about these Follow-up very closely with PCP in 2-4 weeks.   Microscopic hematuria Dysuria today,  no signs of infection on urinalysis Microscopic hematuria with 5-10 rbc's  Per hpf. Recommend repeat urinalysis and micro-next visit, consider referral for evaluation of microscopic  Persistent hematuria.    Orders Placed This Encounter  Procedures  . POCT urinalysis dipstick  . POCT UA - Microscopic Only

## 2014-09-14 ENCOUNTER — Telehealth: Payer: Self-pay | Admitting: Family Medicine

## 2014-09-14 NOTE — Telephone Encounter (Signed)
Wants to know urine test

## 2014-09-14 NOTE — Telephone Encounter (Signed)
Will forward to Dr. Ermalinda MemosBradshaw who saw patient for this. Amado Andal,CMA

## 2014-09-15 NOTE — Telephone Encounter (Signed)
Lutricia FeilMarwa is aware of this and appt was made for 09/28/14 with pcp. Yoshiharu Brassell,CMA

## 2014-09-15 NOTE — Telephone Encounter (Signed)
Patient had blood in urine. Dr. Felipa EmoryBradshaw's recommendation was follow up and repeat UA.

## 2014-09-28 ENCOUNTER — Ambulatory Visit (INDEPENDENT_AMBULATORY_CARE_PROVIDER_SITE_OTHER): Payer: Self-pay | Admitting: Family Medicine

## 2014-09-28 ENCOUNTER — Encounter: Payer: Self-pay | Admitting: Family Medicine

## 2014-09-28 VITALS — BP 158/69 | HR 73 | Temp 98.3°F | Ht 66.0 in | Wt 177.3 lb

## 2014-09-28 DIAGNOSIS — R312 Other microscopic hematuria: Secondary | ICD-10-CM

## 2014-09-28 DIAGNOSIS — R3129 Other microscopic hematuria: Secondary | ICD-10-CM

## 2014-09-28 DIAGNOSIS — R319 Hematuria, unspecified: Secondary | ICD-10-CM

## 2014-09-28 LAB — POCT URINALYSIS DIPSTICK
Bilirubin, UA: NEGATIVE
GLUCOSE UA: NEGATIVE
Ketones, UA: NEGATIVE
NITRITE UA: NEGATIVE
PROTEIN UA: NEGATIVE
SPEC GRAV UA: 1.025
UROBILINOGEN UA: 0.2
pH, UA: 6

## 2014-09-28 LAB — POCT UA - MICROSCOPIC ONLY

## 2014-09-28 NOTE — Progress Notes (Signed)
Patient ID: Nanci PinaFatima Muldrow, female   DOB: 02/28/1946, 69 y.o.   MRN: [id number] Subjective:   CC: Follow-up hematuria  HPI:   Patient presents for follow-up of microscopic hematuria noted at last visit 08/31/2014 with no symptoms at that time. Since that time, she denies any abdominal pain, dysuria, fevers, or chills and has not had any symptoms whatsoever.  Review of Systems - Per HPI.  PMH: Reviewed Smoking status: Nonsmoker    Objective:  Physical Exam BP 158/69 mmHg  Pulse 73  Temp(Src) 98.3 F (36.8 C) (Oral)  Ht 5\' 6"  (1.676 m)  Wt 177 lb 5 oz (80.428 kg)  BMI 28.63 kg/m2 GEN: NAD, well-appearing Abdomen: Soft, nontender, nondistended, no suprapubic tenderness    Assessment:     Nanci PinaFatima Mcinnis is a 69 y.o. female here for follow-up of microscopic hematuria.    Plan:     # See problem list and after visit summary for problem-specific plans.   Follow-up: Follow up in 1 month for nurse visit for repeat UA.  Precepted with Dr Mauricio PoBreen.  Leona SingletonMaria T Griselle Rufer, MD [facility]

## 2014-09-28 NOTE — Patient Instructions (Signed)
The urine has a smaller amount of red blood cells (0-3). Since no insurance, come back in 1 month to repeat the urinalysis (nurse visit). If you develop any symptoms (burning, blood you can see, other complaints), make an appointment with doctor. If abnormal, we will make a plan.  Best,  Leona SingletonMaria T Machaela Caterino, MD

## 2014-09-28 NOTE — Assessment & Plan Note (Addendum)
Asymptomatic. Repeat UA with 0-3 RBCs per high-powered field. -Hold off on referral to urology due to no insurance. -UA at follow-up nurse visit in 1 month, or sooner if any abnormalities develop. -If still abnormal, we'll need to discuss a plan of getting patient in with urology. In the meantime, continue working on insurance. This is slightly complicated by the fact that in just over 1 month, patient will be returning to [place].
